# Patient Record
Sex: Female | Born: 1965 | Race: Black or African American | Hispanic: No | Marital: Married | Smoking: Never smoker
Health system: Southern US, Community
[De-identification: ages and names within clinical notes are randomized; demographics above are authoritative.]

## PROBLEM LIST (undated history)

## (undated) DIAGNOSIS — N951 Menopausal and female climacteric states: Secondary | ICD-10-CM

## (undated) DIAGNOSIS — Z8742 Personal history of other diseases of the female genital tract: Secondary | ICD-10-CM

## (undated) DIAGNOSIS — N898 Other specified noninflammatory disorders of vagina: Secondary | ICD-10-CM

## (undated) DIAGNOSIS — B1910 Unspecified viral hepatitis B without hepatic coma: Secondary | ICD-10-CM

## (undated) DIAGNOSIS — N840 Polyp of corpus uteri: Secondary | ICD-10-CM

## (undated) DIAGNOSIS — Z8679 Personal history of other diseases of the circulatory system: Secondary | ICD-10-CM

## (undated) DIAGNOSIS — D649 Anemia, unspecified: Secondary | ICD-10-CM

## (undated) DIAGNOSIS — N950 Postmenopausal bleeding: Secondary | ICD-10-CM

## (undated) DIAGNOSIS — D219 Benign neoplasm of connective and other soft tissue, unspecified: Secondary | ICD-10-CM

## (undated) DIAGNOSIS — Z87898 Personal history of other specified conditions: Secondary | ICD-10-CM

## (undated) DIAGNOSIS — E079 Disorder of thyroid, unspecified: Secondary | ICD-10-CM

## (undated) DIAGNOSIS — Z8619 Personal history of other infectious and parasitic diseases: Secondary | ICD-10-CM

## (undated) DIAGNOSIS — M62830 Muscle spasm of back: Secondary | ICD-10-CM

## (undated) DIAGNOSIS — Z87440 Personal history of urinary (tract) infections: Secondary | ICD-10-CM

## (undated) HISTORY — DX: Personal history of other infectious and parasitic diseases: Z86.19

## (undated) HISTORY — DX: Postmenopausal bleeding: N95.0

## (undated) HISTORY — DX: Benign neoplasm of connective and other soft tissue, unspecified: D21.9

## (undated) HISTORY — DX: Polyp of corpus uteri: N84.0

## (undated) HISTORY — DX: Unspecified viral hepatitis B without hepatic coma: B19.10

## (undated) HISTORY — PX: WISDOM TOOTH EXTRACTION: SHX21

## (undated) HISTORY — DX: Personal history of urinary (tract) infections: Z87.440

## (undated) HISTORY — DX: Personal history of other diseases of the female genital tract: Z87.42

## (undated) HISTORY — DX: Personal history of other diseases of the circulatory system: Z86.79

## (undated) HISTORY — DX: Personal history of other specified conditions: Z87.898

## (undated) HISTORY — DX: Other specified noninflammatory disorders of vagina: N89.8

## (undated) HISTORY — DX: Anemia, unspecified: D64.9

## (undated) HISTORY — DX: Menopausal and female climacteric states: N95.1

---

## 1998-10-24 ENCOUNTER — Inpatient Hospital Stay (HOSPITAL_COMMUNITY): Admission: AD | Admit: 1998-10-24 | Discharge: 1998-10-26 | Payer: Self-pay | Admitting: Obstetrics and Gynecology

## 1998-10-26 ENCOUNTER — Encounter (HOSPITAL_COMMUNITY): Admission: RE | Admit: 1998-10-26 | Discharge: 1999-01-24 | Payer: Self-pay | Admitting: Obstetrics and Gynecology

## 1999-06-19 ENCOUNTER — Emergency Department (HOSPITAL_COMMUNITY): Admission: EM | Admit: 1999-06-19 | Discharge: 1999-06-19 | Payer: Self-pay | Admitting: Emergency Medicine

## 2001-10-07 ENCOUNTER — Ambulatory Visit (HOSPITAL_COMMUNITY): Admission: RE | Admit: 2001-10-07 | Discharge: 2001-10-07 | Payer: Self-pay | Admitting: Internal Medicine

## 2001-10-07 ENCOUNTER — Encounter: Payer: Self-pay | Admitting: Internal Medicine

## 2001-12-27 ENCOUNTER — Encounter: Payer: Self-pay | Admitting: *Deleted

## 2001-12-27 ENCOUNTER — Ambulatory Visit (HOSPITAL_COMMUNITY): Admission: RE | Admit: 2001-12-27 | Discharge: 2001-12-27 | Payer: Self-pay | Admitting: *Deleted

## 2002-03-23 ENCOUNTER — Encounter: Payer: Self-pay | Admitting: Internal Medicine

## 2002-03-23 ENCOUNTER — Ambulatory Visit (HOSPITAL_COMMUNITY): Admission: RE | Admit: 2002-03-23 | Discharge: 2002-03-23 | Payer: Self-pay | Admitting: Internal Medicine

## 2003-07-11 ENCOUNTER — Emergency Department (HOSPITAL_COMMUNITY): Admission: EM | Admit: 2003-07-11 | Discharge: 2003-07-11 | Payer: Self-pay | Admitting: Emergency Medicine

## 2003-08-09 ENCOUNTER — Ambulatory Visit (HOSPITAL_COMMUNITY): Admission: RE | Admit: 2003-08-09 | Discharge: 2003-08-09 | Payer: Self-pay | Admitting: Internal Medicine

## 2004-03-29 ENCOUNTER — Ambulatory Visit (HOSPITAL_COMMUNITY): Admission: RE | Admit: 2004-03-29 | Discharge: 2004-03-29 | Payer: Self-pay | Admitting: Internal Medicine

## 2006-03-26 ENCOUNTER — Ambulatory Visit: Payer: Self-pay | Admitting: Internal Medicine

## 2006-03-26 LAB — CONVERTED CEMR LAB: WBC, blood: 5.2 10*3/uL

## 2006-03-27 ENCOUNTER — Encounter (INDEPENDENT_AMBULATORY_CARE_PROVIDER_SITE_OTHER): Payer: Self-pay | Admitting: Internal Medicine

## 2006-03-27 LAB — CONVERTED CEMR LAB: TSH: 0.6 u[IU]/mL

## 2006-03-30 ENCOUNTER — Encounter: Payer: Self-pay | Admitting: Internal Medicine

## 2006-03-30 DIAGNOSIS — J45909 Unspecified asthma, uncomplicated: Secondary | ICD-10-CM | POA: Insufficient documentation

## 2007-08-27 ENCOUNTER — Ambulatory Visit: Payer: Self-pay | Admitting: Internal Medicine

## 2007-08-27 ENCOUNTER — Other Ambulatory Visit: Admission: RE | Admit: 2007-08-27 | Discharge: 2007-08-27 | Payer: Self-pay | Admitting: Internal Medicine

## 2007-08-27 ENCOUNTER — Encounter (INDEPENDENT_AMBULATORY_CARE_PROVIDER_SITE_OTHER): Payer: Self-pay | Admitting: Internal Medicine

## 2007-08-27 ENCOUNTER — Telehealth (INDEPENDENT_AMBULATORY_CARE_PROVIDER_SITE_OTHER): Payer: Self-pay | Admitting: *Deleted

## 2007-08-27 DIAGNOSIS — J309 Allergic rhinitis, unspecified: Secondary | ICD-10-CM | POA: Insufficient documentation

## 2007-09-08 ENCOUNTER — Ambulatory Visit (HOSPITAL_COMMUNITY): Admission: RE | Admit: 2007-09-08 | Discharge: 2007-09-08 | Payer: Self-pay | Admitting: Internal Medicine

## 2007-09-10 ENCOUNTER — Encounter (INDEPENDENT_AMBULATORY_CARE_PROVIDER_SITE_OTHER): Payer: Self-pay | Admitting: Internal Medicine

## 2007-09-14 ENCOUNTER — Encounter (INDEPENDENT_AMBULATORY_CARE_PROVIDER_SITE_OTHER): Payer: Self-pay | Admitting: Internal Medicine

## 2007-09-14 LAB — CONVERTED CEMR LAB
BUN: 14 mg/dL (ref 6–23)
CO2: 27 meq/L (ref 19–32)
Glucose, Bld: 96 mg/dL (ref 70–99)
Potassium: 4.4 meq/L (ref 3.5–5.3)
Sodium: 141 meq/L (ref 135–145)
Total CHOL/HDL Ratio: 3.6
VLDL: 12 mg/dL (ref 0–40)

## 2007-09-22 ENCOUNTER — Encounter (INDEPENDENT_AMBULATORY_CARE_PROVIDER_SITE_OTHER): Payer: Self-pay | Admitting: Internal Medicine

## 2008-01-26 ENCOUNTER — Ambulatory Visit: Payer: Self-pay | Admitting: Internal Medicine

## 2008-01-26 DIAGNOSIS — N760 Acute vaginitis: Secondary | ICD-10-CM | POA: Insufficient documentation

## 2008-01-26 LAB — CONVERTED CEMR LAB
Chlamydia, DNA Probe: NEGATIVE
GC Probe Amp, Genital: NEGATIVE

## 2008-02-03 ENCOUNTER — Encounter (INDEPENDENT_AMBULATORY_CARE_PROVIDER_SITE_OTHER): Payer: Self-pay | Admitting: Internal Medicine

## 2008-02-03 LAB — CONVERTED CEMR LAB: Candida species: NEGATIVE

## 2008-02-07 ENCOUNTER — Telehealth (INDEPENDENT_AMBULATORY_CARE_PROVIDER_SITE_OTHER): Payer: Self-pay | Admitting: Internal Medicine

## 2008-05-26 ENCOUNTER — Emergency Department (HOSPITAL_COMMUNITY): Admission: EM | Admit: 2008-05-26 | Discharge: 2008-05-27 | Payer: Self-pay | Admitting: Emergency Medicine

## 2008-07-19 ENCOUNTER — Ambulatory Visit: Payer: Self-pay | Admitting: Internal Medicine

## 2008-07-19 DIAGNOSIS — M545 Low back pain, unspecified: Secondary | ICD-10-CM | POA: Insufficient documentation

## 2008-07-19 DIAGNOSIS — N951 Menopausal and female climacteric states: Secondary | ICD-10-CM | POA: Insufficient documentation

## 2008-07-20 ENCOUNTER — Telehealth (INDEPENDENT_AMBULATORY_CARE_PROVIDER_SITE_OTHER): Payer: Self-pay | Admitting: Internal Medicine

## 2008-11-28 ENCOUNTER — Ambulatory Visit: Payer: Self-pay | Admitting: Internal Medicine

## 2008-11-29 LAB — CONVERTED CEMR LAB
BUN: 11 mg/dL (ref 6–23)
CO2: 27 meq/L (ref 19–32)
Calcium: 9.5 mg/dL (ref 8.4–10.5)
Cholesterol: 247 mg/dL — ABNORMAL HIGH (ref 0–200)
Creatinine, Ser: 0.86 mg/dL (ref 0.40–1.20)
Glucose, Bld: 79 mg/dL (ref 70–99)

## 2008-12-13 ENCOUNTER — Ambulatory Visit (HOSPITAL_COMMUNITY): Admission: RE | Admit: 2008-12-13 | Discharge: 2008-12-13 | Payer: Self-pay | Admitting: Internal Medicine

## 2009-01-09 ENCOUNTER — Encounter (INDEPENDENT_AMBULATORY_CARE_PROVIDER_SITE_OTHER): Payer: Self-pay | Admitting: Internal Medicine

## 2009-04-14 DIAGNOSIS — N840 Polyp of corpus uteri: Secondary | ICD-10-CM

## 2009-04-14 HISTORY — DX: Polyp of corpus uteri: N84.0

## 2009-06-11 ENCOUNTER — Ambulatory Visit: Payer: Self-pay | Admitting: Family Medicine

## 2009-06-11 DIAGNOSIS — E785 Hyperlipidemia, unspecified: Secondary | ICD-10-CM | POA: Insufficient documentation

## 2009-06-11 DIAGNOSIS — N950 Postmenopausal bleeding: Secondary | ICD-10-CM | POA: Insufficient documentation

## 2009-06-19 ENCOUNTER — Encounter: Payer: Self-pay | Admitting: Physician Assistant

## 2009-06-20 LAB — CONVERTED CEMR LAB
Cholesterol: 209 mg/dL — ABNORMAL HIGH (ref 0–200)
HDL: 52 mg/dL (ref >39)
Total CHOL/HDL Ratio: 4
Triglycerides: 57 mg/dL (ref <150)
VLDL: 11 mg/dL (ref 0–40)

## 2009-11-08 ENCOUNTER — Emergency Department (HOSPITAL_COMMUNITY)
Admission: EM | Admit: 2009-11-08 | Discharge: 2009-11-08 | Payer: Self-pay | Source: Home / Self Care | Admitting: Emergency Medicine

## 2010-01-31 ENCOUNTER — Ambulatory Visit: Payer: Self-pay | Admitting: Physician Assistant

## 2010-01-31 DIAGNOSIS — J019 Acute sinusitis, unspecified: Secondary | ICD-10-CM | POA: Insufficient documentation

## 2010-01-31 DIAGNOSIS — H6120 Impacted cerumen, unspecified ear: Secondary | ICD-10-CM | POA: Insufficient documentation

## 2010-04-14 DIAGNOSIS — Z8742 Personal history of other diseases of the female genital tract: Secondary | ICD-10-CM

## 2010-04-14 DIAGNOSIS — Z87898 Personal history of other specified conditions: Secondary | ICD-10-CM

## 2010-04-14 HISTORY — DX: Personal history of other diseases of the female genital tract: Z87.42

## 2010-04-14 HISTORY — DX: Personal history of other specified conditions: Z87.898

## 2010-04-30 ENCOUNTER — Ambulatory Visit (HOSPITAL_COMMUNITY)
Admission: RE | Admit: 2010-04-30 | Discharge: 2010-04-30 | Payer: Self-pay | Source: Home / Self Care | Attending: Obstetrics and Gynecology | Admitting: Obstetrics and Gynecology

## 2010-05-05 ENCOUNTER — Encounter: Payer: Self-pay | Admitting: Internal Medicine

## 2010-05-16 NOTE — Assessment & Plan Note (Signed)
Summary: New Patient/rrs   Vital Signs:  Patient profile:   45 year old female Menstrual status:  postmenopausal Height:      68 inches Weight:      215 pounds BMI:     32.81 O2 Sat:      97 % on Room air Pulse rate:   81 / minute Resp:     16 per minute BP sitting:   110 / 80  (left arm)  Vitals Entered By: Adella Hare LPN (June 11, 2009 3:29 PM) CC: new patient Is Patient Diabetic? No Pain Assessment Patient in pain? no        CC:  new patient.  History of Present Illness: Pt here to establish care with aPCP.  c/o hot flashes daily, disruptive to sleep, wakes her up at night.  Pt states she had menstrual bleeding about 2 wks ago x 3-4 days.  Had been a couple of yrs since her LMP.  No HRT hx.  Did have Premarin prescribed by Dr Jen Mow, but never took them.  Hx of seasonal allergies & asthma.  In the past had prescription for zyrtec.  Uses albuterol occasionally. Usually only needs it during an allergy flare up.   Asthma History    Asthma Control Assessment:    Age range: 12+ years    Symptoms: 0-2 days/week    Nighttime Awakenings: 0-2/month    Interferes w/ normal activity: no limitations    SABA use (not for EIB): 0-2 days/week    Exacerbations requiring oral systemic steroids: 0-1/year    Asthma Control Assessment: Well Controlled   Current Medications (verified): 1)  None  Allergies (verified): 1)  Codeine  Past History:  Past medical, surgical, family and social histories (including risk factors) reviewed for relevance to current acute and chronic problems.  Past Medical History: Reviewed history from 08/27/2007 and no changes required. Asthma Allergic rhinitis  Past Surgical History: Reviewed history from 08/27/2007 and no changes required. Denies surgical history  Family History: Reviewed history from 07/19/2008 and no changes required. father-63HTN, hyperlipidemia mother-60-hyperlipidemia sister-39 brother-38 daughter-9  Social  History: Reviewed history from 08/27/2007 and no changes required. Married lives with spouse and child Never Smoked Alcohol use-no Drug use-no  Review of Systems General:  Denies chills and fever. ENT:  Denies earache, nasal congestion, sinus pressure, and sore throat. CV:  Denies chest pain or discomfort. Resp:  Denies cough and shortness of breath. GU:  Complains of abnormal vaginal bleeding; denies discharge and dysuria. Heme:  Denies enlarge lymph nodes and fevers. Allergy:  Complains of seasonal allergies.  Physical Exam  General:  Well-developed,well-nourished,in no acute distress; alert,appropriate and cooperative throughout examination Head:  Normocephalic and atraumatic without obvious abnormalities. No apparent alopecia or balding. Ears:  External ear exam shows no significant lesions or deformities.  Otoscopic examination reveals clear canals, tympanic membranes are intact bilaterally without bulging, retraction, inflammation or discharge. Hearing is grossly normal bilaterally. Nose:  External nasal examination shows no deformity or inflammation. Nasal mucosa are pink and moist without lesions or exudates. Mouth:  Oral mucosa and oropharynx without lesions or exudates.  Teeth in good repair. Neck:  No deformities, masses, or tenderness noted. Lungs:  Normal respiratory effort, chest expands symmetrically. Lungs are clear to auscultation, no crackles or wheezes. Heart:  Normal rate and regular rhythm. S1 and S2 normal without gallop, murmur, click, rub or other extra sounds. Cervical Nodes:  No lymphadenopathy noted Psych:  Cognition and judgment appear intact. Alert and cooperative with  normal attention span and concentration. No apparent delusions, illusions, hallucinations   Impression & Recommendations:  Problem # 1:  POSTMENOPAUSAL BLEEDING (ICD-627.1) Assessment New  The following medications were removed from the medication list:    Prempro 0.3-1.5 Mg Tabs (Conj  estrog-medroxyprogest ace) .Marland Kitchen... 1 by mouth once daily  Orders: T-TSH ([address]109-60454) Gynecologic Referral (Gyn)  Problem # 2:  MENOPAUSE-RELATED VASOMOTOR SYMPTOMS, HOT FLASHES (ICD-627.2) Assessment: Unchanged Discussed with pt that she would need evaluation of post menopausal bleeeding before consideration or discussion of HRT.  She can discuss this further with the gyn.  This has been an ongoing problem.   The following medications were removed from the medication list:    Prempro 0.3-1.5 Mg Tabs (Conj estrog-medroxyprogest ace) .Marland Kitchen... 1 by mouth once daily  Orders: Gynecologic Referral (Gyn)  Problem # 3:  ALLERGIC RHINITIS (ICD-477.9) Assessment: Unchanged  Her updated medication list for this problem includes:    Fexofenadine Hcl 180 Mg Tabs (Fexofenadine hcl) .Marland Kitchen... 1 daily for allergies  Problem # 4:  ASTHMA (ICD-493.90) Assessment: Improved  The following medications were removed from the medication list:    Proventil Hfa 108 (90 Base) Mcg/act Aers (Albuterol sulfate)    Symbicort 80-4.5 Mcg/act Aero (Budesonide-formoterol fumarate) .Marland Kitchen... 2 puffs two times a day  Problem # 5:  HYPERLIPIDEMIA (ICD-272.4) Pt recently started exercising with a trainer.  Encouraged her to continue.  Also to eat a healthier diet.   Orders: T-Lipid Profile (09811-91478)    HDL:55 (11/28/2008), 65 (09/10/2007)  LDL:170 (11/28/2008), 157 (09/10/2007)  Chol:247 (11/28/2008), 234 (09/10/2007)  Trig:108 (11/28/2008), 62 (09/10/2007)  Complete Medication List: 1)  Fexofenadine Hcl 180 Mg Tabs (Fexofenadine hcl) .Marland Kitchen.. 1 daily for allergies  Other Orders: T-Vitamin D (25-Hydroxy) ([id number])  Patient Instructions: 1)  Please schedule a follow-up appointment in 3 months. 2)  We will refer you to a gynecologist for evaluation of your vaginal bleeding. 3)  Have blood work drawn fasting to check your cholesterol levels. 4)  It is important that you exercise regularly at least 20 minutes 5 times a  week. If you develop chest pain, have severe difficulty breathing, or feel very tired , stop exercising immediately and seek medical attention. 5)  You need to lose weight. Consider a lower calorie diet and regular exercise.  Prescriptions: FEXOFENADINE HCL 180 MG TABS (FEXOFENADINE HCL) 1 daily for allergies  #30 x 5   Entered and Authorized by:   Esperanza Sheets PA   Signed by:   Adella Hare LPN on 57/84/6962   Method used:   Electronically to        [id number]       [address]       [place], [place]  [address]       Ph: [phone]       Fax: [phone]   RxID:   [id number]

## 2010-05-16 NOTE — Assessment & Plan Note (Signed)
Summary: earache/slj   Vital Signs:  Patient profile:   45 year old female Menstrual status:  postmenopausal Height:      68 inches Weight:      215.25 pounds BMI:     32.85 O2 Sat:      99 % on Room air Pulse rate:   81 / minute Resp:     16 per minute BP sitting:   118 / 80  (left arm)  Vitals Entered By: Mauricia Area CMA (January 31, 2010 11:08 AM) CC: Left earache for approximately 1 week   CC:  Left earache for approximately 1 week.  History of Present Illness: Pt presents today with c/o Lt ear pain x couple days.  Worse at night when lies down.  Using Motrin, and does help.  Also is having nasal congestion, post nasal drainage, and nonprod cough. Hx of asthma and has been using inhaler more frequently due to cough and chest congestion. Pt shows me a Symbicort inhaler and admits using this prn, 3-4 times a day. No albuterol inhaler at home.  No fever or chills.   Current Medications (verified): 1)  Motrin 200 Mg .Marland Kitchen.. 1 Tab By Mouth 4 Times A Day 2)  Symbicort 80-4.5 Mcg/act Aero (Budesonide-Formoterol Fumarate) .... 2 Puffs 2-3 Times Day  Allergies (verified): 1)  Codeine  Past History:  Past medical history reviewed for relevance to current acute and chronic problems.  Past Medical History: Reviewed history from 08/27/2007 and no changes required. Asthma Allergic rhinitis  Review of Systems General:  Denies chills and fever. ENT:  Complains of earache and postnasal drainage; denies sinus pressure and sore throat. CV:  Denies chest pain or discomfort and palpitations. Resp:  Complains of cough, shortness of breath, and wheezing; denies sputum productive.  Physical Exam  General:  Well-developed,well-nourished,in no acute distress; alert,appropriate and cooperative throughout examination Head:  Normocephalic and atraumatic without obvious abnormalities. No apparent alopecia or balding. Ears:  R ear normal.  Lt EAC lg amt of cerumen present, after irrg Lt TM  slightly bulging w/o erythema Nose:  External nasal examination shows no deformity or inflammation. Nasal mucosa are pink and moist without lesions or exudates. Mouth:  Oral mucosa and oropharynx without lesions or exudates.  Teeth in good repair. Neck:  No deformities, masses, or tenderness noted. Lungs:  Normal respiratory effort, chest expands symmetrically. Lungs are clear to auscultation, no crackles or wheezes. Heart:  Normal rate and regular rhythm. S1 and S2 normal without gallop, murmur, click, rub or other extra sounds. Cervical Nodes:  No lymphadenopathy noted Psych:  Cognition and judgment appear intact. Alert and cooperative with normal attention span and concentration. No apparent delusions, illusions, hallucinations   Impression & Recommendations:  Problem # 1:  SINUSITIS, ACUTE (ICD-461.9) Assessment New  Her updated medication list for this problem includes:    Amoxicillin 875 Mg Tabs (Amoxicillin) .Marland Kitchen... Take 1 two times a day x 10 days  Problem # 2:  CERUMEN IMPACTION, LEFT (ICD-380.4)  Orders: Cerumen Impaction Removal (04540)  Complete Medication List: 1)  Ibuprofen 800 Mg Tabs (Ibuprofen) .... Take one tab three times a day as needed for pain 2)  Symbicort 80-4.5 Mcg/act Aero (Budesonide-formoterol fumarate) .... 2 puffs two times a day for asthma 3)  Ventolin Hfa 108 (90 Base) Mcg/act Aers (Albuterol sulfate) .... 2 puffs every 4 hrs as needed 4)  Amoxicillin 875 Mg Tabs (Amoxicillin) .... Take 1 two times a day x 10 days  Patient Instructions: 1)  Please schedule a follow-up appointment in 2 months. 2)  I have refilled your inhalers.  Symbicort is a preventive inhaler only, and is not for 'rescue" use.  Use albuterol inhaler for "rescue", this is the Ventolin inhaler. 3)  I have prescribed  an antibiotic for you. Prescriptions: SYMBICORT 80-4.5 MCG/ACT AERO (BUDESONIDE-FORMOTEROL FUMARATE) 2 puffs two times a day for asthma  #1 x 2   Entered and Authorized  by:   Esperanza Sheets PA   Signed by:   Esperanza Sheets PA on 01/31/2010   Method used:   [facility]ron[facility] to        [phone][facility] (retail)       [address]       [place], [place]  [address]       Ph: [phone]       Fax: [phone]   RxID:   [id number] IBUPROFEN 800 MG TABS (IBUPROFEN) take one tab three times a day as needed for pain  #30 x 0   Entered and Authorized by:   Esperanza Sheets PA   Signed by:   Esperanza Sheets PA on 01/31/2010   Method used:   Electronically to        [id number]       [address]       [place], [place]  [address]       Ph: [phone]       Fax: [phone]   RxID:   [id number] AMOXICILLIN 875 MG TABS (AMOXICILLIN) take 1 two times a day x 10 days  #20 x 0   Entered and Authorized by:   Esperanza Sheets PA   Signed by:   Esperanza Sheets PA on 01/31/2010   Method used:   Electronically to        [id number]       [address]       [place], [place]  [address]       Ph: [phone]       Fax: [phone]   RxID:   [id number] VENTOLIN HFA 108 (90 BASE) MCG/ACT AERS (ALBUTEROL SULFATE) 2 puffs every 4 hrs as needed  #1 x 1   Entered and Authorized by:   Esperanza Sheets PA   Signed by:   Esperanza Sheets PA on 01/31/2010   Method used:   Electronically to        [facility] [phone]* (retail)       [address]       [place], [place]  [address]       Ph: [phone]       Fax: [phone]   RxID:   [id number]    Orders Added: 1)  Est. Patient Level III [27782] 2)  Cerumen Impaction Removal [42353]

## 2010-06-29 LAB — RAPID STREP SCREEN (MED CTR MEBANE ONLY): Streptococcus, Group A Screen (Direct): NEGATIVE

## 2010-12-25 DIAGNOSIS — N898 Other specified noninflammatory disorders of vagina: Secondary | ICD-10-CM

## 2010-12-25 HISTORY — DX: Other specified noninflammatory disorders of vagina: N89.8

## 2010-12-26 ENCOUNTER — Ambulatory Visit: Payer: Self-pay | Admitting: Family Medicine

## 2011-03-08 ENCOUNTER — Emergency Department (HOSPITAL_COMMUNITY)
Admission: EM | Admit: 2011-03-08 | Discharge: 2011-03-08 | Disposition: A | Discharge status: Home / Self Care | Payer: BC Managed Care – PPO | Attending: Emergency Medicine | Admitting: Emergency Medicine

## 2011-03-08 ENCOUNTER — Encounter: Payer: Self-pay | Admitting: *Deleted

## 2011-03-08 DIAGNOSIS — M549 Dorsalgia, unspecified: Secondary | ICD-10-CM

## 2011-03-08 DIAGNOSIS — M546 Pain in thoracic spine: Secondary | ICD-10-CM | POA: Insufficient documentation

## 2011-03-08 HISTORY — DX: Muscle spasm of back: M62.830

## 2011-03-08 MED ORDER — IBUPROFEN 800 MG PO TABS: 800.0000 mg | ORAL_TABLET | Freq: Three times a day (TID) | ORAL | Status: AC

## 2011-03-08 MED ORDER — METHOCARBAMOL 500 MG PO TABS: 500.0000 mg | ORAL_TABLET | Freq: Two times a day (BID) | ORAL | Status: AC

## 2011-03-08 MED ORDER — TRAMADOL HCL 50 MG PO TABS: 50.0000 mg | ORAL_TABLET | Freq: Four times a day (QID) | ORAL | Status: AC | PRN

## 2011-03-08 NOTE — ED Notes (Signed)
Pt. Has a hx. Of "back spasms."  Pt. Reports that she awoke last night with "back spams" that are causing her to have shortness of breath.  Pt. Denies n/v/d, fever, and denies pain when she voids.  Pt. Denies any recent injuries.

## 2011-03-08 NOTE — ED Provider Notes (Signed)
History     CSN: [id number] Arrival date & time: 03/08/2011  7:46 AM   First MD Initiated Contact with Patient 03/08/11 0752     8:44 AM HPI Patient is a 45 y.o. female presenting with back pain. The history is provided by the patient.  Back Pain  This is a new problem. The current episode started more than 1 week ago. The problem has been gradually worsening. The pain is associated with no known injury. Pain location: Bilateral upper back. Quality: Spasms. The pain is severe. The symptoms are aggravated by bending, twisting and certain positions (Palpation). Pertinent negatives include no chest pain, no fever, no numbness, no headaches, no abdominal pain, no bowel incontinence, no perianal numbness, no bladder incontinence, no dysuria, no pelvic pain, no leg pain, no paresthesias, no paresis, no tingling and no weakness. She has tried heat, ice and NSAIDs for the symptoms. The treatment provided mild relief.    Past Medical History  Diagnosis Date  . Back spasm     History reviewed. No pertinent past surgical history.  History reviewed. No pertinent family history.  History  Substance Use Topics  . Smoking status: Not on file  . Smokeless tobacco: Not on file  . Alcohol Use: No    OB History    Grav Para Term Preterm Abortions TAB SAB Ect Mult Living                  Review of Systems  Constitutional: Negative for fever and chills.  HENT: Negative for neck pain and neck stiffness.   Cardiovascular: Negative for chest pain.  Gastrointestinal: Negative for abdominal pain and bowel incontinence.  Genitourinary: Negative for bladder incontinence, dysuria, urgency, frequency, hematuria, flank pain and pelvic pain.  Musculoskeletal: Positive for back pain.       Denies saddle anesthesias, or perineal numbness, urinary or bowel incontinence  Neurological: Negative for tingling, weakness, numbness, headaches and paresthesias.    Allergies  Codeine  Home Medications    Current Outpatient Rx  Name Route Sig Dispense Refill  . IBUPROFEN 800 MG PO TABS Oral Take 800 mg by mouth daily.        BP 122/82  Pulse 70  Temp(Src) 98.1 F (36.7 C) (Oral)  Resp 16  SpO2 96%  LMP 02/17/2011  Physical Exam  Vitals reviewed. Constitutional: She is oriented to person, place, and time. She appears well-developed and well-nourished.  HENT:  Head: Normocephalic and atraumatic.  Eyes: Conjunctivae and EOM are normal. Pupils are equal, round, and reactive to light.  Neck: Normal range of motion. Neck supple. No spinous process tenderness and no muscular tenderness present. No edema, no erythema and normal range of motion present.  Cardiovascular: Normal rate, regular rhythm and normal heart sounds.  Exam reveals no friction rub.   No murmur heard. Pulmonary/Chest: Effort normal and breath sounds normal. She has no wheezes. She has no rales. She exhibits no tenderness.  Abdominal: Soft. Bowel sounds are normal. She exhibits no distension and no mass. There is no tenderness. There is no rebound and no guarding.          Musculoskeletal: Normal range of motion.       Cervical back: Normal. She exhibits normal range of motion, no tenderness, no bony tenderness, no swelling and no pain.       Thoracic back: Normal. She exhibits no tenderness, no bony tenderness, no swelling, no deformity and no pain.       Lumbar back: Normal.  She exhibits normal range of motion, no tenderness, no bony tenderness, no swelling, no deformity and no pain.       Arms:      No midline spinal tenderness. Has full range of motion. Pain with palpation over bilateral musculature of back. No erythema, deformities, masses. No cervical spine or neck tenderness. Full range of motion of arms and legs. Normal strength and sensation distally. Normal pulses distally.  Neurological: She is alert and oriented to person, place, and time. She has normal strength. No sensory deficit. Coordination and gait  normal.  Skin: Skin is warm and dry. No rash noted. No erythema. No pallor.    ED Course  Procedures  MDM           Thomasene Lot, [place] 03/08/11 [phone]

## 2011-03-08 NOTE — ED Provider Notes (Signed)
Evaluation and management procedures were performed by the PA/NP under my supervision/collaboration.   Dione Booze, MD 03/08/11 [phone]

## 2011-03-23 ENCOUNTER — Encounter (HOSPITAL_COMMUNITY): Payer: Self-pay | Admitting: *Deleted

## 2011-03-23 ENCOUNTER — Emergency Department (HOSPITAL_COMMUNITY)
Admission: EM | Admit: 2011-03-23 | Discharge: 2011-03-23 | Disposition: A | Discharge status: Home / Self Care | Payer: BC Managed Care – PPO | Attending: Emergency Medicine | Admitting: Emergency Medicine

## 2011-03-23 DIAGNOSIS — J111 Influenza due to unidentified influenza virus with other respiratory manifestations: Secondary | ICD-10-CM | POA: Insufficient documentation

## 2011-03-23 MED ORDER — OSELTAMIVIR PHOSPHATE 75 MG PO CAPS: 75.0000 mg | ORAL_CAPSULE | Freq: Two times a day (BID) | ORAL | Status: AC

## 2011-03-23 NOTE — ED Provider Notes (Signed)
History     CSN: [id number] Arrival date & time: 03/23/2011  1:06 AM   First MD Initiated Contact with Patient 03/23/11 0138      Chief Complaint  Patient presents with  . Cough  . Nasal Congestion    (Consider location/radiation/quality/duration/timing/severity/associated sxs/prior treatment) Patient is a 45 y.o. female presenting with cough. The history is provided by the patient. No language interpreter was used.  Cough This is a new problem. The current episode started 2 days ago. The problem occurs constantly. The problem has not changed since onset.The cough is non-productive. The maximum temperature recorded prior to her arrival was 100 to 100.9 F. The fever has been present for 1 to 2 days. Associated symptoms include ear pain, rhinorrhea, sore throat and myalgias. Pertinent negatives include no chest pain, no chills, no sweats, no weight loss, no ear congestion, no headaches, no shortness of breath, no wheezing and no eye redness. She has tried nothing for the symptoms. The treatment provided no relief. Risk factors: patient exposure. She is not a smoker. Her past medical history does not include pneumonia.    Past Medical History  Diagnosis Date  . Back spasm     History reviewed. No pertinent past surgical history.  History reviewed. No pertinent family history.  History  Substance Use Topics  . Smoking status: Not on file  . Smokeless tobacco: Not on file  . Alcohol Use: No    OB History    Grav Para Term Preterm Abortions TAB SAB Ect Mult Living                  Review of Systems  Constitutional: Negative for chills and weight loss.  HENT: Positive for ear pain, sore throat and rhinorrhea.   Eyes: Negative for redness.  Respiratory: Positive for cough. Negative for shortness of breath and wheezing.   Cardiovascular: Negative for chest pain.  Gastrointestinal: Negative for abdominal pain.  Genitourinary: Negative for difficulty urinating.    Musculoskeletal: Positive for myalgias.  Skin: Negative.   Neurological: Negative for headaches.  Hematological: Negative.   Psychiatric/Behavioral: Negative.     Allergies  Codeine  Home Medications   Current Outpatient Rx  Name Route Sig Dispense Refill  . IBUPROFEN 800 MG PO TABS Oral Take 800 mg by mouth daily.      . OSELTAMIVIR PHOSPHATE 75 MG PO CAPS Oral Take 1 capsule (75 mg total) by mouth 2 (two) times daily. 10 capsule 0    BP 146/58  Pulse 64  Temp 98.4 F (36.9 C)  Resp 20  Ht 5\' 9"  (1.753 m)  Wt 205 lb (92.987 kg)  BMI 30.27 kg/m2  SpO2 96%  LMP 02/17/2011  Physical Exam  Constitutional: She is oriented to person, place, and time. She appears well-developed and well-nourished. No distress.  HENT:  Head: Normocephalic and atraumatic.  Right Ear: No mastoid tenderness. Tympanic membrane is not injected. No hemotympanum.  Left Ear: No mastoid tenderness. Tympanic membrane is not injected. No hemotympanum.  Mouth/Throat: Oropharynx is clear and moist. No oropharyngeal exudate.  Eyes: Conjunctivae and EOM are normal. Pupils are equal, round, and reactive to light.  Neck: Normal range of motion. Neck supple. No tracheal deviation present.  Cardiovascular: Normal rate and regular rhythm.  Exam reveals no friction rub.   Pulmonary/Chest: Effort normal and breath sounds normal. She has no wheezes. She has no rales.  Abdominal: Soft. Bowel sounds are normal.  Musculoskeletal: Normal range of motion. She exhibits no edema.  Neurological: She is alert and oriented to person, place, and time.  Skin: Skin is warm and dry.  Psychiatric: Thought content normal.    ED Course  Procedures (including critical care time)  Labs Reviewed - No data to display No results found.   1. Influenza       MDM  Symptoms suspicious for influenza.  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  Take tamiflu for symptoms and tylenol and motrin.  Follow up with  family doctor in 2 days for recheck return for worsening symptoms.  Patient verbalizes understanding and agrees to follow up       Maxime Beckner Smitty Cords, MD 03/23/11 0157

## 2011-03-23 NOTE — ED Notes (Signed)
Pt c/o cough, congestion, and sneezing since Thursday.

## 2011-03-23 NOTE — ED Notes (Signed)
Pt reports cough and congestion since Thursday.  Denies cough being productive, denies fever.

## 2011-05-15 ENCOUNTER — Encounter: Payer: Self-pay | Admitting: Family Medicine

## 2011-05-15 ENCOUNTER — Ambulatory Visit (INDEPENDENT_AMBULATORY_CARE_PROVIDER_SITE_OTHER): Payer: BC Managed Care – PPO | Admitting: Family Medicine

## 2011-05-15 DIAGNOSIS — J45909 Unspecified asthma, uncomplicated: Secondary | ICD-10-CM

## 2011-05-15 DIAGNOSIS — J069 Acute upper respiratory infection, unspecified: Secondary | ICD-10-CM

## 2011-05-15 MED ORDER — BENZONATATE 100 MG PO CAPS: 100.0000 mg | ORAL_CAPSULE | Freq: Three times a day (TID) | ORAL | Status: DC | PRN

## 2011-05-15 MED ORDER — AZITHROMYCIN 250 MG PO TABS: ORAL_TABLET | ORAL | Status: AC

## 2011-05-15 MED ORDER — BECLOMETHASONE DIPROPIONATE 40 MCG/ACT IN AERS: 2.0000 | INHALATION_SPRAY | Freq: Two times a day (BID) | RESPIRATORY_TRACT | Status: DC

## 2011-05-15 MED ORDER — ALBUTEROL SULFATE HFA 108 (90 BASE) MCG/ACT IN AERS: 2.0000 | INHALATION_SPRAY | RESPIRATORY_TRACT | Status: DC | PRN

## 2011-05-15 NOTE — Patient Instructions (Signed)
Take the antibiotics as prescribed Start the inhaler QVAR as prescribed - rinse your mouth after use Albuterol was refilled Tessalon Perrle for cough Return if you are not improved

## 2011-05-16 NOTE — Progress Notes (Signed)
  Subjective:    Patient ID: Brenda Montoya, female    DOB: Dec 10, 1965, 46 y.o.   MRN: [id number]  HPI   Cough for the past week with mild production. History of asthma. Has been using her albuterol more than usual. Occasionally has wheezing denies shortness of breath. She was treated for influenza approximately one month ago and completed a course of Tamiflu. She's also noticed she is very hoarse. Non smoker  Review of Systems  GEN- denies fatigue, fever, weight loss,weakness,+ recent illness HEENT- denies eye drainage, change in vision, nasal discharge, CVS- denies chest pain, palpitations RESP- denies SOB,+ cough,+ wheeze ABD- denies N/V, change in stools, abd pain MSK- denies joint pain, muscle aches, injury Neuro- denies headache, dizziness, syncope, seizure activity      Objective:   Physical Exam GEN- NAD, alert and oriented x3, hoarse HEENT- PERRL, EOMI, non injected sclera, pink conjunctiva, MMM, oropharynx clear, TM clear bilat Neck- Supple, no cervical nodes CVS- RRR, no murmur RESP-good air movement, harsh cough, normal WOB, few wheeze, no rhonchi, upper airway congestion EXT- No edema Pulses- Radial, DP- 2+        Assessment & Plan:    URI- will treat as second illness after influenza, does not appear to have pneumonia today however history of asthma. Treat for bronchitis- Z pak given  Asthma- pt to start QVAR for maintaince during winter months, albuterol refilled

## 2011-06-27 ENCOUNTER — Other Ambulatory Visit: Payer: Self-pay | Admitting: Obstetrics and Gynecology

## 2011-06-27 DIAGNOSIS — Z139 Encounter for screening, unspecified: Secondary | ICD-10-CM

## 2011-07-01 ENCOUNTER — Ambulatory Visit (HOSPITAL_COMMUNITY)
Admission: RE | Admit: 2011-07-01 | Discharge: 2011-07-01 | Disposition: A | Discharge status: Home / Self Care | Payer: BC Managed Care – PPO | Source: Ambulatory Visit | Attending: Obstetrics and Gynecology | Admitting: Obstetrics and Gynecology

## 2011-07-01 DIAGNOSIS — Z1231 Encounter for screening mammogram for malignant neoplasm of breast: Secondary | ICD-10-CM | POA: Insufficient documentation

## 2011-07-01 DIAGNOSIS — Z139 Encounter for screening, unspecified: Secondary | ICD-10-CM

## 2011-07-29 ENCOUNTER — Ambulatory Visit (INDEPENDENT_AMBULATORY_CARE_PROVIDER_SITE_OTHER): Payer: BC Managed Care – PPO | Admitting: Obstetrics and Gynecology

## 2011-07-29 VITALS — BP 116/70 | HR 78 | Temp 99.0°F | Resp 16 | Ht 69.0 in | Wt 211.0 lb

## 2011-07-29 DIAGNOSIS — N393 Stress incontinence (female) (male): Secondary | ICD-10-CM

## 2011-07-29 DIAGNOSIS — Z124 Encounter for screening for malignant neoplasm of cervix: Secondary | ICD-10-CM

## 2011-07-29 DIAGNOSIS — G4700 Insomnia, unspecified: Secondary | ICD-10-CM | POA: Insufficient documentation

## 2011-07-29 MED ORDER — ZOLPIDEM TARTRATE 10 MG PO TABS: 10.0000 mg | ORAL_TABLET | Freq: Every evening | ORAL | Status: DC | PRN

## 2011-07-29 NOTE — Progress Notes (Addendum)
Allergies: NKDA  LMP: Post menapausal  Last Pap: 06/2010- Normal    Contraception: No  Gravida: 1 Para: 1 Primary Doctor: Dr. Sharmaine Base EXAM  Regular Periods no   Monthly Breast Ex. yes Tetanus<10 years yes NI. Bladder Function yes Daily BM's yes Healthy Diet yes Calcium/Multi-vitamin no Mammogram yes Date: 06/2011 Smoker no How much? Alcohol Abuseno How much? Substance Abuseno How much? Exercise yes How much? 3 x's a week Seatbelts yes Abuse at Home no Stressful Work no Sigmoid/Colonoscopy in past 5 years no Medications:  Medical problems this year: None  PMH: No   FMH:  No  ABDOMINAL/PELVIC PAIN no  VAGINAL DISCHARGE no  IRREGULAR BLEEDING no  MENOPAUSAL SYMPTOMS yes  Irregular Periods no Mood Swings yes Hot Flashes yes Vaginal Dryness yes Poor Sleeping yes Urinary Urgency no UTI Symptoms no HRT no Fam. HX of Osteoporosis no Prior Bone Scan no Hx of Deep Vein Thrombosis no

## 2011-07-29 NOTE — Progress Notes (Signed)
Subjective:    Brenda Montoya is a 46 y.o. female who presents for annual exam. The patient has no complaints today. The patient is sexually active. GYN screening history: last pap: was normal and last mammogram: was normal. The patient is not taking hormone replacement therapy. Patient denies post-menopausal vaginal bleeding.. The patient wears seatbelts: yes. The patient participates in regular exercise: no. Has the patient ever been transfused or tattooed?: yes. The patient reports that there is not domestic violence in her life.Some SUI. Insomnia continues.   Menstrual History: OB History    Grav Para Term Preterm Abortions TAB SAB Ect Mult Living                  Menarche age: normal Patient's last menstrual period was 02/27/2011.    The following portions of the patient's history were reviewed and updated as appropriate: allergies, current medications, past family history, past medical history, past social history, past surgical history and problem list.  Review of Systems Pertinent items are noted in HPI.    Objective:    BP 116/70  Pulse 78  Temp 99 F (37.2 C)  Resp 16  Ht 5\' 9"  (1.753 m)  Wt 211 lb (95.709 kg)  BMI 31.16 kg/m2  LMP 02/27/2011 General appearance: alert, cooperative and appears stated age Neck: supple, symmetrical, trachea midline and thyroid not enlarged, symmetric, no tenderness/mass/nodules Lungs: clear to auscultation bilaterally Breasts: normal appearance, no masses or tenderness Heart: regular rate and rhythm, S1, S2 normal, no murmur, click, rub or gallop Abdomen: soft, non-tender; bowel sounds normal; no masses,  no organomegaly Pelvic: cervix normal in appearance, external genitalia normal, no adnexal masses or tenderness, no bladder tenderness, no cervical motion tenderness, rectovaginal septum normal, uterus normal size, shape, and consistency, vagina normal without discharge and small cystocele. Extremities: extremities normal, atraumatic,  no cyanosis or edema     Assessment:    Normal gyn exam Menopause SUI, Insomnia    Plan:    All questions answered. Follow up in 1 year. Mammogram. Pap smear. Kegels, Ambien 10 mg qhs prn insomnia.

## 2011-07-29 NOTE — Patient Instructions (Signed)
Urinary Incontinence Your doctor wants you to have this information about urinary incontinence. This is the inability to keep urine in your body until you decide to release it. CAUSES  Prostate gland enlargement is a common cause of urinary incontinence. But there are many different causes for losing urinary control. They include:  Medicines.   Infections.   Prostate problems.   Surgery.   Neurological diseases.   Emotional factors.  DIAGNOSIS  Evaluating the cause of incontinence is important in choosing the best treatment. This may require:  An ultrasound exam.   Kidney and bladder X-rays.   Cystoscopy. This is an exam of the bladder using a narrow scope.  TREATMENT  For incontinent patients, normal daily hygiene and using changing pads or adult diapers regularly will prevent offensive odors and skin damage from the moisture. Changing your medicines may help control incontinence. Your caregiver may prescribe some medicines to help you regain control. Avoid caffeine. It can over-stimulate the bladder. Use the bathroom regularly. Try about every 2 to 3 hours even if you do not feel the need. Take time to empty your bladder completely. After urinating, wait a minute. Then try to urinate again. External devices used to catch urine or an indwelling urine catheter (Foley catheter) may be needed as well. Some prostate gland problems require surgery to correct. Call your caregiver for more information. Document Released: 05/08/2004 Document Revised: 03/20/2011 Document Reviewed: 05/03/2008 [employer] Patient Information 2012 [employer]. 

## 2011-07-30 LAB — PAP IG W/ RFLX HPV ASCU

## 2011-07-31 ENCOUNTER — Ambulatory Visit: Payer: Self-pay | Admitting: Obstetrics and Gynecology

## 2011-12-30 ENCOUNTER — Encounter: Payer: Self-pay | Admitting: Family Medicine

## 2011-12-30 ENCOUNTER — Ambulatory Visit: Payer: BC Managed Care – PPO | Admitting: Family Medicine

## 2012-02-10 ENCOUNTER — Other Ambulatory Visit: Payer: Self-pay | Admitting: Internal Medicine

## 2012-02-10 DIAGNOSIS — E0590 Thyrotoxicosis, unspecified without thyrotoxic crisis or storm: Secondary | ICD-10-CM

## 2012-02-16 ENCOUNTER — Encounter (HOSPITAL_COMMUNITY): Payer: Self-pay | Admitting: Emergency Medicine

## 2012-02-16 DIAGNOSIS — J45909 Unspecified asthma, uncomplicated: Secondary | ICD-10-CM | POA: Insufficient documentation

## 2012-02-16 DIAGNOSIS — Z8619 Personal history of other infectious and parasitic diseases: Secondary | ICD-10-CM | POA: Insufficient documentation

## 2012-02-16 DIAGNOSIS — Z8679 Personal history of other diseases of the circulatory system: Secondary | ICD-10-CM | POA: Insufficient documentation

## 2012-02-16 DIAGNOSIS — E079 Disorder of thyroid, unspecified: Secondary | ICD-10-CM | POA: Insufficient documentation

## 2012-02-16 DIAGNOSIS — Z8669 Personal history of other diseases of the nervous system and sense organs: Secondary | ICD-10-CM | POA: Insufficient documentation

## 2012-02-16 DIAGNOSIS — Z8742 Personal history of other diseases of the female genital tract: Secondary | ICD-10-CM | POA: Insufficient documentation

## 2012-02-16 DIAGNOSIS — Z87448 Personal history of other diseases of urinary system: Secondary | ICD-10-CM | POA: Insufficient documentation

## 2012-02-16 DIAGNOSIS — Z862 Personal history of diseases of the blood and blood-forming organs and certain disorders involving the immune mechanism: Secondary | ICD-10-CM | POA: Insufficient documentation

## 2012-02-16 DIAGNOSIS — M7120 Synovial cyst of popliteal space [Baker], unspecified knee: Secondary | ICD-10-CM | POA: Insufficient documentation

## 2012-02-16 DIAGNOSIS — Z8739 Personal history of other diseases of the musculoskeletal system and connective tissue: Secondary | ICD-10-CM | POA: Insufficient documentation

## 2012-02-16 NOTE — ED Notes (Signed)
PT. REPORTS "KNOT" AT BACK OF RIGHT KNEE ONSET LAST THURS. DENIES INJURY , PAIN WITH PALPATION , NO DRAINAGE.

## 2012-02-17 ENCOUNTER — Emergency Department (HOSPITAL_COMMUNITY)
Admission: EM | Admit: 2012-02-17 | Discharge: 2012-02-17 | Disposition: A | Discharge status: Home / Self Care | Payer: BC Managed Care – PPO | Attending: Emergency Medicine | Admitting: Emergency Medicine

## 2012-02-17 DIAGNOSIS — M7120 Synovial cyst of popliteal space [Baker], unspecified knee: Secondary | ICD-10-CM

## 2012-02-17 HISTORY — DX: Disorder of thyroid, unspecified: E07.9

## 2012-02-17 MED ORDER — IBUPROFEN 600 MG PO TABS: 600.0000 mg | ORAL_TABLET | Freq: Once | ORAL | Status: DC

## 2012-02-17 MED ORDER — IBUPROFEN 400 MG PO TABS
600.0000 mg | ORAL_TABLET | Freq: Once | ORAL | Status: AC
  Administered 2012-02-17: 600 mg via ORAL
  Filled 2012-02-17: qty 1

## 2012-02-17 NOTE — ED Provider Notes (Signed)
Medical screening examination/treatment/procedure(s) were performed by non-physician practitioner and as supervising physician I was immediately available for consultation/collaboration.    Vida Roller, MD 02/17/12 [phone]

## 2012-02-17 NOTE — ED Provider Notes (Signed)
History     CSN: [id number]  Arrival date & time 02/16/12  2309   First MD Initiated Contact with Patient 02/17/12 0209      Chief Complaint  Patient presents with  . Abscess    (Consider location/radiation/quality/duration/timing/severity/associated sxs/prior treatment) HPI Comments: Patient has noticed a fullness in the back of her right knee.  He is an injury, swelling, or tenderness to her lower leg.  He has not had any recent travel.  She has no shortness of breath.  She does smoke.  She does not use any form of birth control  Patient is a 46 y.o. female presenting with abscess. The history is provided by the patient.  Abscess  This is a new problem. The current episode started less than one week ago. The problem occurs continuously. The problem has been unchanged. Pertinent negatives include no fever.    Past Medical History  Diagnosis Date  . Back spasm   . History of measles, mumps, or rubella   . History of chicken pox   . Asthma   . H/O varicose veins   . H/O bladder infections   . Anemia   . H/O insomnia 2012  . H/O vaginitis 2012  . PMB (postmenopausal bleeding)   . Endometrial polyp 2011  . Fibroid   . H/O amenorrhea   . Menopausal symptoms   . Vaginal irritation 12/25/2010    itching also  . Hepatitis B 1988 & 1989  . Thyroid disease     Past Surgical History  Procedure Date  . Wisdom tooth extraction     Family History  Problem Relation Age of Onset  . Hypertension Maternal Uncle     History  Substance Use Topics  . Smoking status: Never Smoker   . Smokeless tobacco: Not on file  . Alcohol Use: No    OB History    Grav Para Term Preterm Abortions TAB SAB Ect Mult Living                  Review of Systems  Constitutional: Negative for fever and chills.  Respiratory: Negative for shortness of breath.   Gastrointestinal: Negative for nausea.  Skin: Negative for wound.  Neurological: Negative for dizziness, weakness and numbness.     Allergies  Codeine  Home Medications   Current Outpatient Rx  Name  Route  Sig  Dispense  Refill  . ACETAMINOPHEN 500 MG PO TABS   Oral   Take 1,000 mg by mouth every 6 (six) hours as needed. For pain         . IBUPROFEN 600 MG PO TABS   Oral   Take 1 tablet (600 mg total) by mouth once.   30 tablet   9     BP 132/57  Pulse 89  Temp 98.6 F (37 C) (Oral)  Resp 14  SpO2 100%  LMP 02/27/2011  Physical Exam  Constitutional: She appears well-developed and well-nourished.  HENT:  Head: Normocephalic.  Eyes: Pupils are equal, round, and reactive to light.  Cardiovascular: Normal rate.   Pulmonary/Chest: Effort normal.  Musculoskeletal: Normal range of motion. She exhibits edema and tenderness.       Legs:   ED Course  Procedures (including critical care time)  Labs Reviewed - No data to display No results found.   1. Baker's cyst of knee       MDM  Bakers cyst will treat with compression, and anti-inflammatories if not getting, better.  Patient has been  instructed to follow up with her primary care physician        Arman Filter, NP 02/17/12 0236

## 2012-02-17 NOTE — Progress Notes (Signed)
Orthopedic Tech Progress Note Patient Details:  Brenda Montoya Feb 18, 1966 [id number]  Ortho Devices Type of Ortho Device: Knee Sleeve   Haskell Flirt 02/17/2012, 2:51 AM

## 2012-02-25 ENCOUNTER — Encounter (HOSPITAL_COMMUNITY): Admission: RE | Admit: 2012-02-25 | Payer: BC Managed Care – PPO | Source: Ambulatory Visit

## 2012-02-26 ENCOUNTER — Encounter (HOSPITAL_COMMUNITY): Payer: BC Managed Care – PPO

## 2012-03-15 ENCOUNTER — Encounter (HOSPITAL_COMMUNITY)
Admission: RE | Admit: 2012-03-15 | Discharge: 2012-03-15 | Disposition: A | Discharge status: Home / Self Care | Payer: BC Managed Care – PPO | Source: Ambulatory Visit | Attending: Internal Medicine | Admitting: Internal Medicine

## 2012-03-15 DIAGNOSIS — E0590 Thyrotoxicosis, unspecified without thyrotoxic crisis or storm: Secondary | ICD-10-CM | POA: Insufficient documentation

## 2012-03-16 ENCOUNTER — Encounter (HOSPITAL_COMMUNITY)
Admission: RE | Admit: 2012-03-16 | Discharge: 2012-03-16 | Disposition: A | Discharge status: Home / Self Care | Payer: BC Managed Care – PPO | Source: Ambulatory Visit | Attending: Internal Medicine | Admitting: Internal Medicine

## 2012-03-16 DIAGNOSIS — E0590 Thyrotoxicosis, unspecified without thyrotoxic crisis or storm: Secondary | ICD-10-CM | POA: Insufficient documentation

## 2012-03-16 MED ORDER — SODIUM PERTECHNETATE TC 99M INJECTION
9.5000 | Freq: Once | INTRAVENOUS | Status: AC | PRN
  Administered 2012-03-16: 10 via INTRAVENOUS

## 2012-03-16 MED ORDER — SODIUM IODIDE I 131 CAPSULE: 10.2000 | Freq: Once | INTRAVENOUS | Status: AC | PRN

## 2012-05-20 ENCOUNTER — Other Ambulatory Visit: Payer: Self-pay | Admitting: Internal Medicine

## 2012-05-20 DIAGNOSIS — E0500 Thyrotoxicosis with diffuse goiter without thyrotoxic crisis or storm: Secondary | ICD-10-CM

## 2012-05-20 DIAGNOSIS — E0590 Thyrotoxicosis, unspecified without thyrotoxic crisis or storm: Secondary | ICD-10-CM

## 2012-05-27 ENCOUNTER — Encounter (HOSPITAL_COMMUNITY)
Admission: RE | Admit: 2012-05-27 | Discharge: 2012-05-27 | Disposition: A | Discharge status: Home / Self Care | Payer: BC Managed Care – PPO | Source: Ambulatory Visit | Attending: Internal Medicine | Admitting: Internal Medicine

## 2012-05-27 DIAGNOSIS — E0500 Thyrotoxicosis with diffuse goiter without thyrotoxic crisis or storm: Secondary | ICD-10-CM

## 2012-05-27 DIAGNOSIS — E0590 Thyrotoxicosis, unspecified without thyrotoxic crisis or storm: Secondary | ICD-10-CM | POA: Insufficient documentation

## 2012-05-27 MED ORDER — SODIUM IODIDE I 131 CAPSULE: 1408.0000 | Freq: Once | INTRAVENOUS | Status: AC | PRN

## 2012-05-27 MED ORDER — SODIUM IODIDE I 131 CAPSULE
14.8000 | Freq: Once | INTRAVENOUS | Status: AC | PRN
  Administered 2012-05-27: 14.8 via ORAL

## 2012-08-03 ENCOUNTER — Other Ambulatory Visit: Payer: Self-pay

## 2012-08-03 DIAGNOSIS — Z1231 Encounter for screening mammogram for malignant neoplasm of breast: Secondary | ICD-10-CM

## 2012-08-31 ENCOUNTER — Ambulatory Visit
Admission: RE | Admit: 2012-08-31 | Discharge: 2012-08-31 | Disposition: A | Discharge status: Home / Self Care | Payer: BC Managed Care – PPO | Source: Ambulatory Visit

## 2012-08-31 DIAGNOSIS — Z1231 Encounter for screening mammogram for malignant neoplasm of breast: Secondary | ICD-10-CM

## 2013-09-07 ENCOUNTER — Other Ambulatory Visit: Payer: Self-pay

## 2013-09-07 DIAGNOSIS — Z1231 Encounter for screening mammogram for malignant neoplasm of breast: Secondary | ICD-10-CM

## 2013-09-19 ENCOUNTER — Ambulatory Visit
Admission: RE | Admit: 2013-09-19 | Discharge: 2013-09-19 | Disposition: A | Discharge status: Home / Self Care | Payer: BC Managed Care – PPO | Source: Ambulatory Visit

## 2013-09-19 DIAGNOSIS — Z1231 Encounter for screening mammogram for malignant neoplasm of breast: Secondary | ICD-10-CM

## 2014-06-04 ENCOUNTER — Emergency Department (HOSPITAL_COMMUNITY)
Admission: EM | Admit: 2014-06-04 | Discharge: 2014-06-05 | Disposition: A | Discharge status: Home / Self Care | Payer: BLUE CROSS/BLUE SHIELD | Attending: Emergency Medicine | Admitting: Emergency Medicine

## 2014-06-04 ENCOUNTER — Emergency Department (HOSPITAL_COMMUNITY): Payer: BLUE CROSS/BLUE SHIELD

## 2014-06-04 ENCOUNTER — Encounter (HOSPITAL_COMMUNITY): Payer: Self-pay | Admitting: *Deleted

## 2014-06-04 DIAGNOSIS — R109 Unspecified abdominal pain: Secondary | ICD-10-CM

## 2014-06-04 DIAGNOSIS — K921 Melena: Secondary | ICD-10-CM

## 2014-06-04 DIAGNOSIS — J45909 Unspecified asthma, uncomplicated: Secondary | ICD-10-CM | POA: Insufficient documentation

## 2014-06-04 DIAGNOSIS — Z8669 Personal history of other diseases of the nervous system and sense organs: Secondary | ICD-10-CM | POA: Diagnosis not present

## 2014-06-04 DIAGNOSIS — Z8619 Personal history of other infectious and parasitic diseases: Secondary | ICD-10-CM | POA: Diagnosis not present

## 2014-06-04 DIAGNOSIS — Z8739 Personal history of other diseases of the musculoskeletal system and connective tissue: Secondary | ICD-10-CM | POA: Diagnosis not present

## 2014-06-04 DIAGNOSIS — E079 Disorder of thyroid, unspecified: Secondary | ICD-10-CM | POA: Diagnosis not present

## 2014-06-04 DIAGNOSIS — Z862 Personal history of diseases of the blood and blood-forming organs and certain disorders involving the immune mechanism: Secondary | ICD-10-CM | POA: Insufficient documentation

## 2014-06-04 DIAGNOSIS — K625 Hemorrhage of anus and rectum: Secondary | ICD-10-CM | POA: Diagnosis present

## 2014-06-04 DIAGNOSIS — Z8742 Personal history of other diseases of the female genital tract: Secondary | ICD-10-CM | POA: Diagnosis not present

## 2014-06-04 DIAGNOSIS — Z79899 Other long term (current) drug therapy: Secondary | ICD-10-CM | POA: Insufficient documentation

## 2014-06-04 DIAGNOSIS — Z3202 Encounter for pregnancy test, result negative: Secondary | ICD-10-CM | POA: Insufficient documentation

## 2014-06-04 LAB — COMPREHENSIVE METABOLIC PANEL
ALT: 31 U/L (ref 0–35)
AST: 30 U/L (ref 0–37)
Albumin: 3.6 g/dL (ref 3.5–5.2)
Alkaline Phosphatase: 73 U/L (ref 39–117)
Anion gap: 8 (ref 5–15)
BUN: 7 mg/dL (ref 6–23)
CALCIUM: 8.7 mg/dL (ref 8.4–10.5)
CHLORIDE: 103 mmol/L (ref 96–112)
CO2: 24 mmol/L (ref 19–32)
CREATININE: 0.92 mg/dL (ref 0.50–1.10)
GFR calc Af Amer: 85 mL/min — ABNORMAL LOW (ref >90)
GFR, EST NON AFRICAN AMERICAN: 73 mL/min — AB (ref >90)
GLUCOSE: 112 mg/dL — AB (ref 70–99)
POTASSIUM: 3.3 mmol/L — AB (ref 3.5–5.1)
SODIUM: 135 mmol/L (ref 135–145)
TOTAL PROTEIN: 7.7 g/dL (ref 6.0–8.3)
Total Bilirubin: 0.4 mg/dL (ref 0.3–1.2)

## 2014-06-04 LAB — CBC WITH DIFFERENTIAL/PLATELET
BASOS ABS: 0 10*3/uL (ref 0.0–0.1)
BASOS PCT: 0 % (ref 0–1)
EOS ABS: 0 10*3/uL (ref 0.0–0.7)
Eosinophils Relative: 1 % (ref 0–5)
HCT: 36.3 % (ref 36.0–46.0)
HEMOGLOBIN: 11.3 g/dL — AB (ref 12.0–15.0)
LYMPHS ABS: 0.9 10*3/uL (ref 0.7–4.0)
LYMPHS PCT: 11 % — AB (ref 12–46)
MCH: 24.5 pg — ABNORMAL LOW (ref 26.0–34.0)
MCHC: 31.1 g/dL (ref 30.0–36.0)
MCV: 78.7 fL (ref 78.0–100.0)
Monocytes Absolute: 0.3 10*3/uL (ref 0.1–1.0)
Monocytes Relative: 4 % (ref 3–12)
NEUTROS PCT: 84 % — AB (ref 43–77)
Neutro Abs: 7.1 10*3/uL (ref 1.7–7.7)
PLATELETS: 306 10*3/uL (ref 150–400)
RBC: 4.61 MIL/uL (ref 3.87–5.11)
RDW: 15.9 % — AB (ref 11.5–15.5)
WBC: 8.4 10*3/uL (ref 4.0–10.5)

## 2014-06-04 LAB — POC URINE PREG, ED: Preg Test, Ur: NEGATIVE

## 2014-06-04 LAB — LIPASE, BLOOD: LIPASE: 20 U/L (ref 11–59)

## 2014-06-04 MED ORDER — HYDROMORPHONE HCL 1 MG/ML IJ SOLN
1.0000 mg | Freq: Once | INTRAMUSCULAR | Status: AC
  Administered 2014-06-04: 1 mg via INTRAVENOUS
  Filled 2014-06-04: qty 1

## 2014-06-04 MED ORDER — SODIUM CHLORIDE 0.9 % IV BOLUS (SEPSIS)
1000.0000 mL | Freq: Once | INTRAVENOUS | Status: AC
  Administered 2014-06-04: 1000 mL via INTRAVENOUS

## 2014-06-04 MED ORDER — ONDANSETRON 4 MG PO TBDP: ORAL_TABLET | ORAL | Status: DC

## 2014-06-04 MED ORDER — IOHEXOL 300 MG/ML  SOLN
100.0000 mL | Freq: Once | INTRAMUSCULAR | Status: AC | PRN
  Administered 2014-06-04: 100 mL via INTRAVENOUS

## 2014-06-04 MED ORDER — IOHEXOL 300 MG/ML  SOLN
25.0000 mL | INTRAMUSCULAR | Status: AC
  Administered 2014-06-04: 25 mL via ORAL

## 2014-06-04 MED ORDER — ONDANSETRON HCL 4 MG/2ML IJ SOLN
4.0000 mg | Freq: Once | INTRAMUSCULAR | Status: AC
  Administered 2014-06-04: 4 mg via INTRAVENOUS
  Filled 2014-06-04: qty 2

## 2014-06-04 NOTE — ED Provider Notes (Signed)
CSN: [id number]     Arrival date & time 06/04/14  2002 History   First MD Initiated Contact with Patient 06/04/14 2041     Chief Complaint  Patient presents with  . Rectal Bleeding     (Consider location/radiation/quality/duration/timing/severity/associated sxs/prior Treatment) Patient is a 49 y.o. female presenting with abdominal pain.  Abdominal Pain Pain location:  Suprapubic Pain quality: sharp   Pain radiates to:  R flank Pain severity:  Severe Onset quality:  Gradual Duration:  1 day Timing:  Constant Progression:  Worsening Chronicity:  New Context: not previous surgeries and not recent illness   Relieved by:  Nothing Worsened by:  Palpation, vomiting and movement Ineffective treatments:  None tried Associated symptoms: anorexia, diarrhea, hematochezia, nausea and vomiting   Associated symptoms: no chest pain, no chills, no cough, no dysuria, no fatigue, no fever, no hematuria, no shortness of breath, no vaginal bleeding and no vaginal discharge     Past Medical History  Diagnosis Date  . Back spasm   . History of measles, mumps, or rubella   . History of chicken pox   . Asthma   . H/O varicose veins   . H/O bladder infections   . Anemia   . H/O insomnia 2012  . H/O vaginitis 2012  . PMB (postmenopausal bleeding)   . Endometrial polyp 2011  . Fibroid   . H/O amenorrhea   . Menopausal symptoms   . Vaginal irritation 12/25/2010    itching also  . Hepatitis B 1988 & 1989  . Thyroid disease    Past Surgical History  Procedure Laterality Date  . Wisdom tooth extraction     Family History  Problem Relation Age of Onset  . Hypertension Maternal Uncle    History  Substance Use Topics  . Smoking status: Never Smoker   . Smokeless tobacco: Not on file  . Alcohol Use: No   OB History    No data available     Review of Systems  Constitutional: Negative for fever, chills and fatigue.  Respiratory: Negative for cough and shortness of breath.    Cardiovascular: Negative for chest pain.  Gastrointestinal: Positive for nausea, vomiting, abdominal pain, diarrhea, hematochezia and anorexia.  Genitourinary: Negative for dysuria, hematuria, vaginal bleeding and vaginal discharge.  All other systems reviewed and are negative.     Allergies  Codeine  Home Medications   Prior to Admission medications   Medication Sig Start Date End Date Taking? Authorizing Provider  cyclobenzaprine (FLEXERIL) 5 MG tablet Take 5 mg by mouth 3 (three) times daily as needed for muscle spasms.  05/15/14  Yes Historical Provider, MD  HYDROcodone-acetaminophen (NORCO/VICODIN) 5-325 MG per tablet Take 1 tablet by mouth every 6 (six) hours as needed for severe pain.  05/15/14  Yes Historical Provider, MD  ibuprofen (ADVIL,MOTRIN) 800 MG tablet Take 800 mg by mouth daily as needed for moderate pain.  05/15/14  Yes Historical Provider, MD  levothyroxine (SYNTHROID, LEVOTHROID) 100 MCG tablet Take 100 mcg by mouth daily. 03/04/14  Yes Historical Provider, MD  ibuprofen (ADVIL,MOTRIN) 600 MG tablet Take 1 tablet (600 mg total) by mouth once. 02/17/12   Garald Balding, NP  ondansetron (ZOFRAN ODT) 4 MG disintegrating tablet 4mg  ODT q4 hours prn nausea/vomit 06/04/14   Debby Freiberg, MD   BP 100/61 mmHg  Pulse 63  Temp(Src) 98.1 F (36.7 C) (Oral)  Resp 16  Ht 5\' 9"  (1.753 m)  Wt 218 lb (98.884 kg)  BMI 32.18 kg/m2  SpO2 100%  LMP 02/27/2011 Physical Exam  Constitutional: She is oriented to person, place, and time. She appears well-developed and well-nourished.  HENT:  Head: Normocephalic and atraumatic.  Right Ear: External ear normal.  Left Ear: External ear normal.  Eyes: Conjunctivae and EOM are normal. Pupils are equal, round, and reactive to light.  Neck: Normal range of motion. Neck supple.  Cardiovascular: Normal rate, regular rhythm, normal heart sounds and intact distal pulses.   Pulmonary/Chest: Effort normal and breath sounds normal.  Abdominal:  Soft. Bowel sounds are normal. There is tenderness in the right lower quadrant, suprapubic area and left lower quadrant.  Musculoskeletal: Normal range of motion.  Neurological: She is alert and oriented to person, place, and time.  Skin: Skin is warm and dry.  Vitals reviewed.   ED Course  Procedures (including critical care time) Labs Review Labs Reviewed  CBC WITH DIFFERENTIAL/PLATELET - Abnormal; Notable for the following:    Hemoglobin 11.3 (*)    MCH 24.5 (*)    RDW 15.9 (*)    Neutrophils Relative % 84 (*)    Lymphocytes Relative 11 (*)    All other components within normal limits  COMPREHENSIVE METABOLIC PANEL - Abnormal; Notable for the following:    Potassium 3.3 (*)    Glucose, Bld 112 (*)    GFR calc non Af Amer 73 (*)    GFR calc Af Amer 85 (*)    All other components within normal limits  URINALYSIS, ROUTINE W REFLEX MICROSCOPIC - Abnormal; Notable for the following:    Hgb urine dipstick TRACE (*)    All other components within normal limits  LIPASE, BLOOD  URINE MICROSCOPIC-ADD ON  POC URINE PREG, ED    Imaging Review Ct Abdomen Pelvis W Contrast  06/04/2014   CLINICAL DATA:  Lower abdominal pain. Bright red blood in stools this morning.  EXAM: CT ABDOMEN AND PELVIS WITH CONTRAST  TECHNIQUE: Multidetector CT imaging of the abdomen and pelvis was performed using the standard protocol following bolus administration of intravenous contrast.  CONTRAST:  1 OMNIPAQUE IOHEXOL 300 MG/ML SOLN, 152mL OMNIPAQUE IOHEXOL 300 MG/ML SOLN  COMPARISON:  None.  FINDINGS: Atelectasis in the lung bases.  Single stone in the gallbladder. No gallbladder wall thickening. No bile duct dilatation. Liver, spleen, pancreas, adrenal glands, kidneys, abdominal aorta, inferior vena cava, and retroperitoneal lymph nodes are unremarkable. Stomach, small bowel, and colon are not abnormally distended. No wall thickening is appreciated. No free air or free fluid in the abdomen. Abdominal wall  musculature appears intact.  Pelvis: Appendix is normal. Uterus and ovaries are not enlarged. Bladder wall is not thickened. No free or loculated pelvic fluid collections. No pelvic mass or lymphadenopathy. No destructive bone lesions.  IMPRESSION: Cholelithiasis with single stone in the gallbladder. No inflammatory changes. No acute process demonstrated in the abdomen or pelvis.   Electronically Signed   By: Lucienne Capers M.D.   On: 06/04/2014 23:33     EKG Interpretation None      MDM   Final diagnoses:  Abdominal pain  Hematochezia    49 y.o. female with pertinent PMH of prior back spasm, known fibroids presents with n/v/d and abd pain since yesterday.  Pain is crampy and associated with vomiting and diarreha.  No fevers.  On arrival vitals and physical exam as above.    Wu unremarkable.  No bleeding on rectal exam.  No stool for hemoccult sample.  Likely gastroenteritis with anal fissure as source of bleeding.  CT scan ordered.  Pt care to Dr. Christy Gentles pending dispo with plan to dc home  I have reviewed all laboratory and imaging studies if ordered as above  1. Hematochezia   2. Abdominal pain         Debby Freiberg, MD 06/06/14 [phone]

## 2014-06-04 NOTE — ED Notes (Signed)
THE PT SAW SOME BRIGHT RED BLOOD IN HER STOOLS THIS AM.  NO KNOWN HEMORRHOIDS.  No previous history.  .  Chronic back pain.  She had ibu and vicodin last pm for the back pain

## 2014-06-04 NOTE — Discharge Instructions (Signed)
Abdominal Pain Many things can cause abdominal pain. Usually, abdominal pain is not caused by a disease and will improve without treatment. It can often be observed and treated at home. Your health care provider will do a physical exam and possibly order blood tests and X-rays to help determine the seriousness of your pain. However, in many cases, more time must pass before a clear cause of the pain can be found. Before that point, your health care provider may not know if you need more testing or further treatment. HOME CARE INSTRUCTIONS  Monitor your abdominal pain for any changes. The following actions may help to alleviate any discomfort you are experiencing:  Only take over-the-counter or prescription medicines as directed by your health care provider.  Do not take laxatives unless directed to do so by your health care provider.  Try a clear liquid diet (broth, tea, or water) as directed by your health care provider. Slowly move to a bland diet as tolerated. SEEK MEDICAL CARE IF:  You have unexplained abdominal pain.  You have abdominal pain associated with nausea or diarrhea.  You have pain when you urinate or have a bowel movement.  You experience abdominal pain that wakes you in the night.  You have abdominal pain that is worsened or improved by eating food.  You have abdominal pain that is worsened with eating fatty foods.  You have a fever. SEEK IMMEDIATE MEDICAL CARE IF:   Your pain does not go away within 2 hours.  You keep throwing up (vomiting).  Your pain is felt only in portions of the abdomen, such as the right side or the left lower portion of the abdomen.  You pass bloody or black tarry stools. MAKE SURE YOU:  Understand these instructions.   Will watch your condition.   Will get help right away if you are not doing well or get worse.  Document Released: 01/08/2005 Document Revised: 04/05/2013 Document Reviewed: 12/08/2012 [facility] Patient Information  2015 [place], [place]. This information is not intended to replace advice given to you by your health care provider. Make sure you discuss any questions you have with your health care provider.  Bloody Diarrhea Bloody diarrhea can be caused by many different conditions. Most of the time bloody diarrhea is the result of food poisoning or minor infections. Bloody diarrhea usually improves over 2 to 3 days of rest and fluid replacement. Other conditions that can cause bloody diarrhea include:  Internal bleeding.  Infection.  Diseases of the bowel and colon. Internal bleeding from an ulcer or bowel disease can be severe and requires hospital care or even surgery. DIAGNOSIS  To find out what is wrong your caregiver may check your:  Stool.  Blood.  Results from a test that looks inside the body (endoscopy). TREATMENT   Get plenty of rest.  Drink enough water and fluids to keep your urine clear or pale yellow.  Do not smoke.  Solid foods and dairy products should be avoided until your illness improves.  As you improve, slowly return to a regular diet with easily-digested foods first. Examples are:  Bananas.  Rice.  Toast.  Crackers. You should only need these for about 2 days before adding more normal foods to your diet.  Avoid spicy or fatty foods as well as caffeine and alcohol for several days.  Medicine to control cramping and diarrhea can relieve symptoms but may prolong some cases of bloody diarrhea. Antibiotics can speed recovery from diarrhea due to some bacterial infections.  Call your caregiver if diarrhea does not get better in 3 days. SEEK MEDICAL CARE IF:   You do not improve after 3 days.  Your diarrhea improves but your stool appears black. SEEK IMMEDIATE MEDICAL CARE IF:   You become extremely weak or faint.  You become very sweaty.  You have increased pain or bleeding.  You develop repeated vomiting.  You vomit and you see blood or the vomit looks  black in color.  You have a fever. Document Released: 03/31/2005 Document Revised: 06/23/2011 Document Reviewed: 03/02/2009 [facility] Patient Information 2015 [place], [place]. This information is not intended to replace advice given to you by your health care provider. Make sure you discuss any questions you have with your health care provider.

## 2014-06-05 LAB — URINALYSIS, ROUTINE W REFLEX MICROSCOPIC
Bilirubin Urine: NEGATIVE
GLUCOSE, UA: NEGATIVE mg/dL
KETONES UR: NEGATIVE mg/dL
Leukocytes, UA: NEGATIVE
NITRITE: NEGATIVE
PROTEIN: NEGATIVE mg/dL
Specific Gravity, Urine: 1.015 (ref 1.005–1.030)
UROBILINOGEN UA: 0.2 mg/dL (ref 0.0–1.0)
pH: 5.5 (ref 5.0–8.0)

## 2014-06-05 LAB — URINE MICROSCOPIC-ADD ON

## 2014-06-05 MED ORDER — OXYCODONE-ACETAMINOPHEN 5-325 MG PO TABS
2.0000 | ORAL_TABLET | Freq: Once | ORAL | Status: AC
  Administered 2014-06-05: 2 via ORAL
  Filled 2014-06-05: qty 2

## 2014-06-05 NOTE — ED Provider Notes (Signed)
I assumed care at signout to f/u urine testing No uti Pt improved abd soft to palpation Will d/c home Advised of cholelithiasis and may need outpatient surgical consultation   Sharyon Cable, MD 06/05/14 0110

## 2014-11-06 ENCOUNTER — Other Ambulatory Visit: Payer: Self-pay

## 2014-11-06 DIAGNOSIS — Z1231 Encounter for screening mammogram for malignant neoplasm of breast: Secondary | ICD-10-CM

## 2014-11-07 ENCOUNTER — Ambulatory Visit
Admission: RE | Admit: 2014-11-07 | Discharge: 2014-11-07 | Disposition: A | Discharge status: Home / Self Care | Payer: BLUE CROSS/BLUE SHIELD | Source: Ambulatory Visit

## 2014-11-07 DIAGNOSIS — Z1231 Encounter for screening mammogram for malignant neoplasm of breast: Secondary | ICD-10-CM

## 2015-07-30 IMAGING — CT CT ABD-PELV W/ CM
2 of 5 series · 16 of 46 positions shown, 18 images · IV contrast (Omni 300)
Comparison: None.

CLINICAL DATA: Lower abdominal pain. Bright red blood in stools
this morning.

EXAM:
CT ABDOMEN AND PELVIS WITH CONTRAST
TECHNIQUE: Multidetector CT imaging of the abdomen and pelvis was performed
using the standard protocol following bolus administration of
intravenous contrast.
CONTRAST:  1 OMNIPAQUE IOHEXOL 300 MG/ML SOLN, 100mL OMNIPAQUE
IOHEXOL 300 MG/ML SOLN

[Series 2: abd/ pelvis 5.0 i30f 1 · axial · 0.76mm/px · z∈[-438,-4]mm · 13 of 97 slices shown, 15 images]
[im 5/97  soft-tissue]
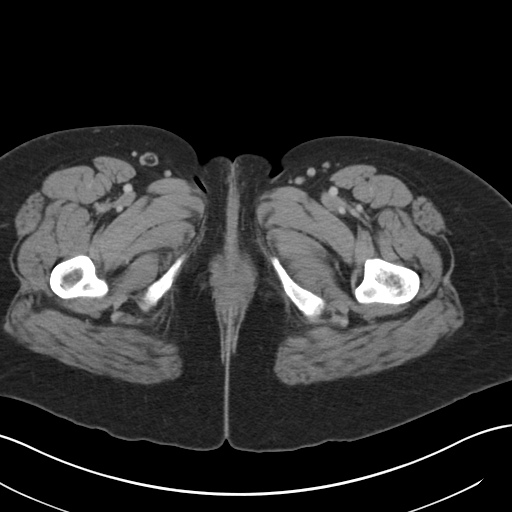
[im 5/97  bone]
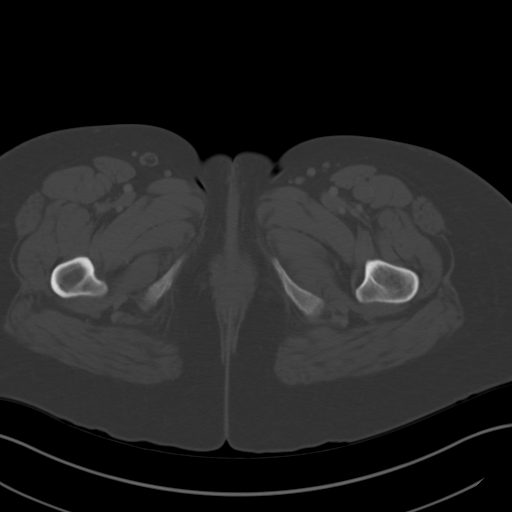
[im 15/97  soft-tissue]
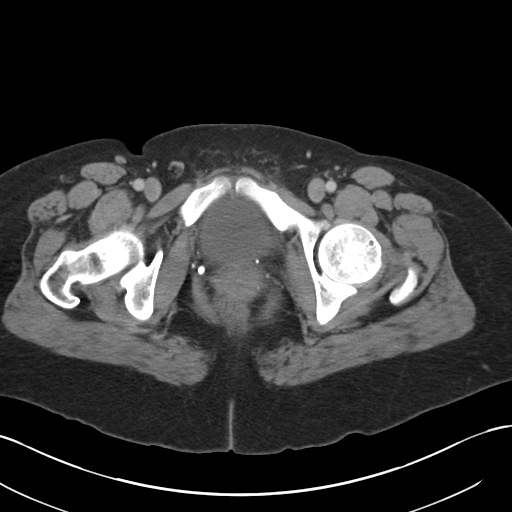
[im 20/97  soft-tissue]
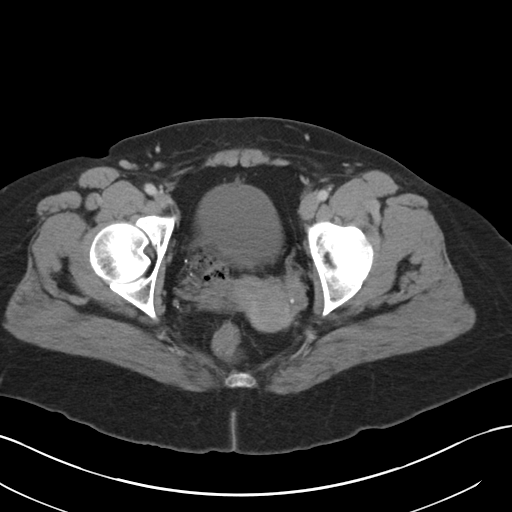
[im 29/97  soft-tissue]
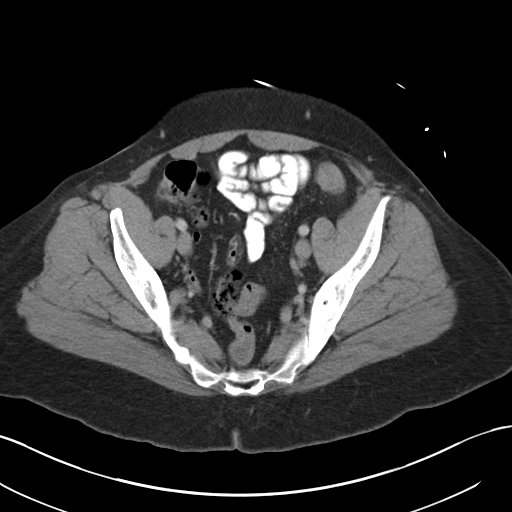
[im 34/97  soft-tissue]
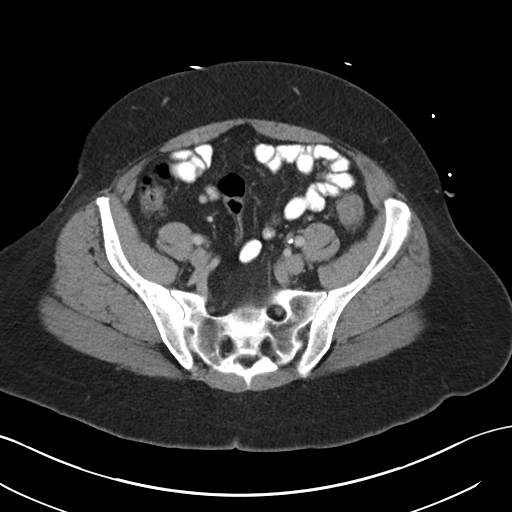
[im 44/97  soft-tissue]
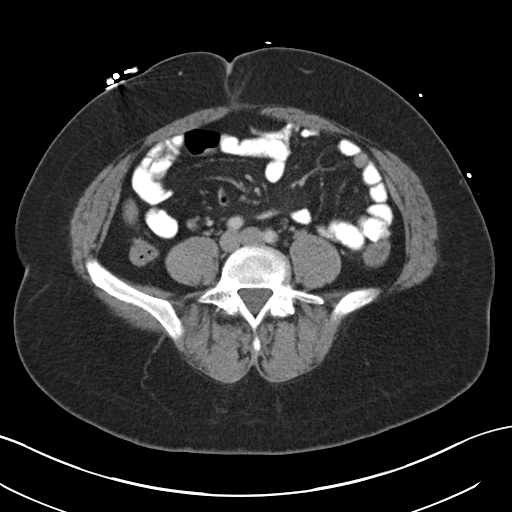
[im 49/97  soft-tissue]
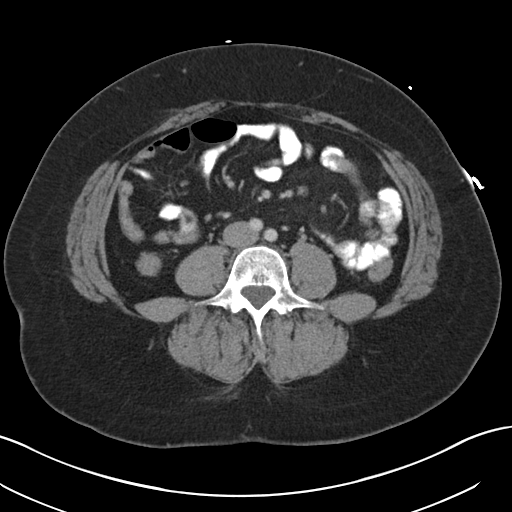
[im 53/97  soft-tissue]
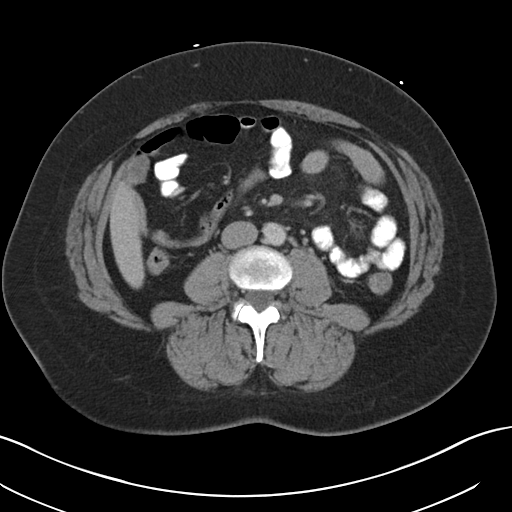
[im 63/97  soft-tissue]
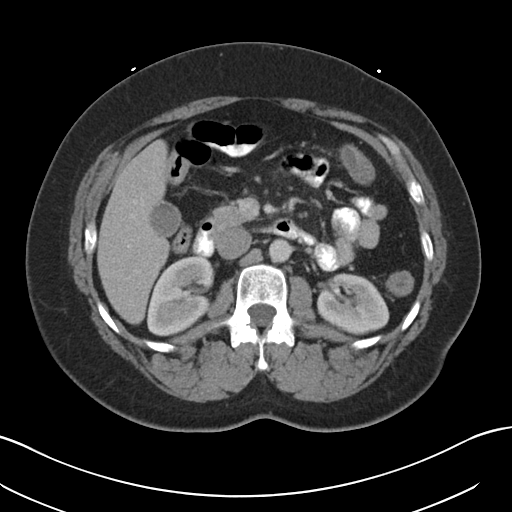
[im 63/97  bone]
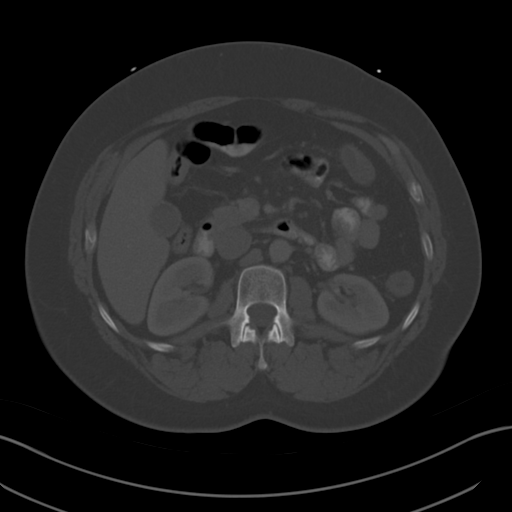
[im 68/97  soft-tissue]
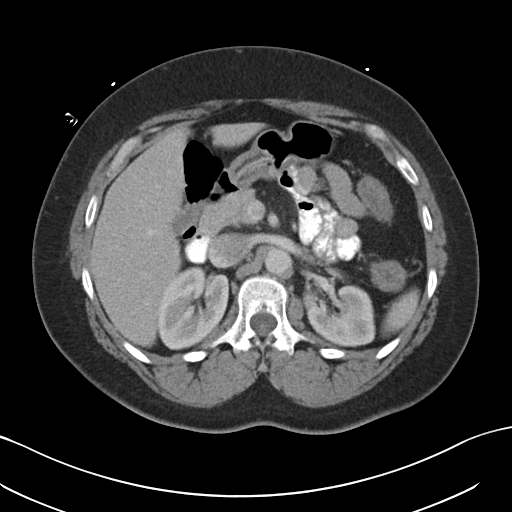
[im 77/97  soft-tissue]
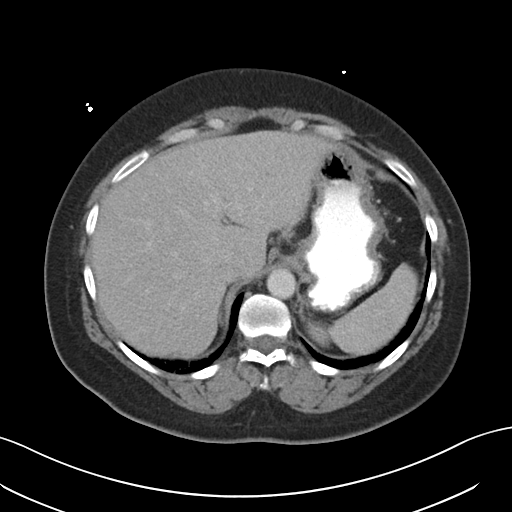
[im 82/97  soft-tissue]
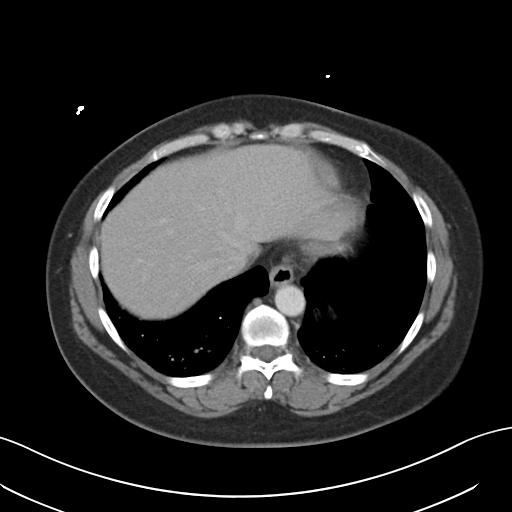
[im 92/97  soft-tissue]
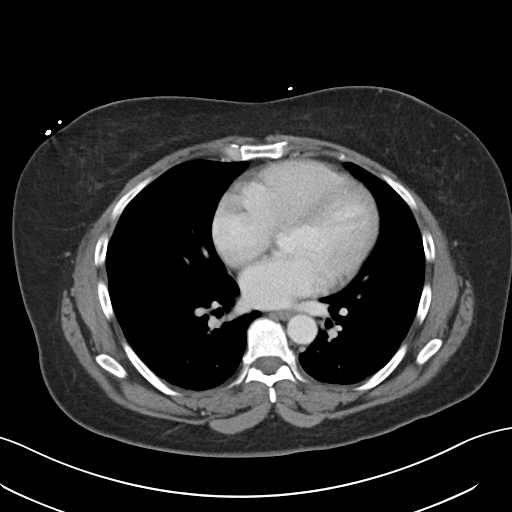

[Series 5: coronals · coronal · 0.70mm/px · 3 of 161 slices shown]
[im 54/161  soft-tissue]
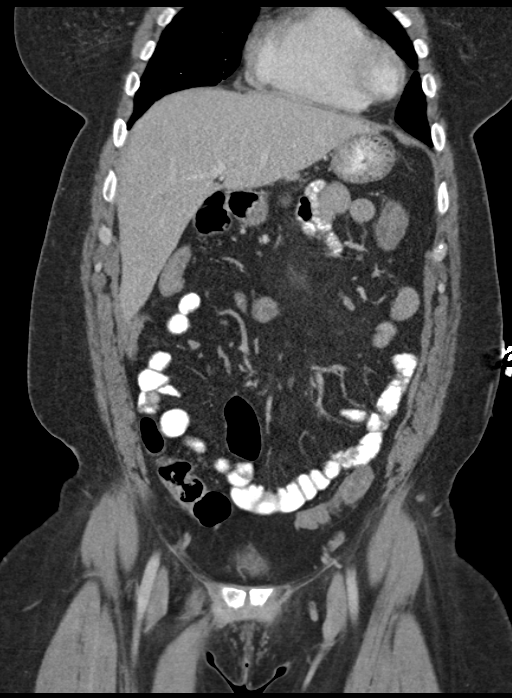
[im 72/161  soft-tissue]
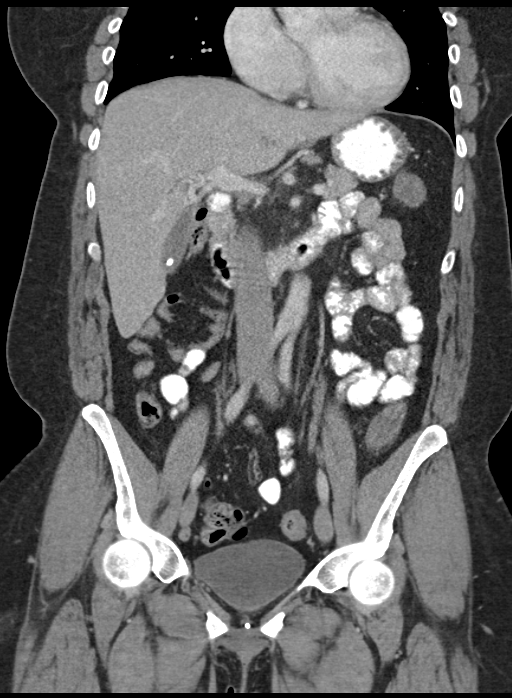
[im 89/161  soft-tissue]
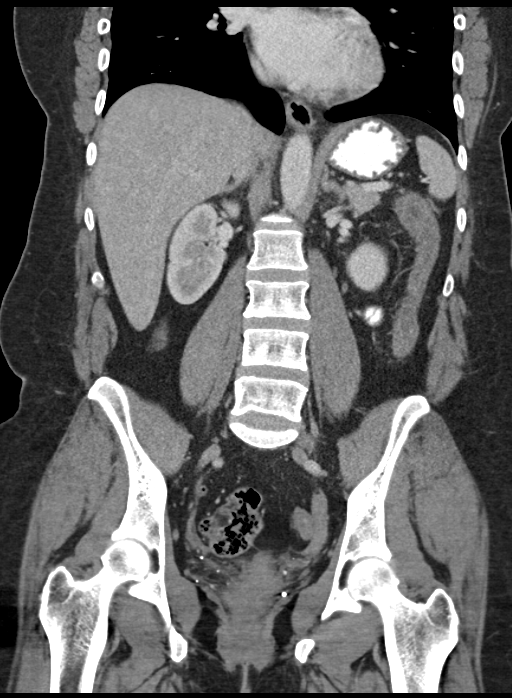

[16 of 46 positions shown; findings below may reference images not displayed]

FINDINGS: Atelectasis in the lung bases.

Single stone in the gallbladder. No gallbladder wall thickening. No
bile duct dilatation. Liver, spleen, pancreas, adrenal glands,
kidneys, abdominal aorta, inferior vena cava, and retroperitoneal
lymph nodes are unremarkable. Stomach, small bowel, and colon are
not abnormally distended. No wall thickening is appreciated. No free
air or free fluid in the abdomen. Abdominal wall musculature appears
intact.

Pelvis: Appendix is normal. Uterus and ovaries are not enlarged.
Bladder wall is not thickened. No free or loculated pelvic fluid
collections. No pelvic mass or lymphadenopathy. No destructive bone
lesions.
IMPRESSION: Cholelithiasis with single stone in the gallbladder. No inflammatory
changes. No acute process demonstrated in the abdomen or pelvis.

## 2015-11-05 ENCOUNTER — Other Ambulatory Visit: Payer: Self-pay | Admitting: Family Medicine

## 2015-11-05 DIAGNOSIS — Z1231 Encounter for screening mammogram for malignant neoplasm of breast: Secondary | ICD-10-CM

## 2015-11-14 ENCOUNTER — Ambulatory Visit
Admission: RE | Admit: 2015-11-14 | Discharge: 2015-11-14 | Disposition: A | Discharge status: Home / Self Care | Payer: BLUE CROSS/BLUE SHIELD | Source: Ambulatory Visit | Attending: Family Medicine | Admitting: Family Medicine

## 2015-11-14 DIAGNOSIS — Z1231 Encounter for screening mammogram for malignant neoplasm of breast: Secondary | ICD-10-CM

## 2017-05-06 ENCOUNTER — Other Ambulatory Visit: Payer: Self-pay | Admitting: Obstetrics and Gynecology

## 2017-05-06 DIAGNOSIS — Z1231 Encounter for screening mammogram for malignant neoplasm of breast: Secondary | ICD-10-CM

## 2020-12-14 ENCOUNTER — Ambulatory Visit (INDEPENDENT_AMBULATORY_CARE_PROVIDER_SITE_OTHER): Payer: PRIVATE HEALTH INSURANCE

## 2020-12-14 ENCOUNTER — Other Ambulatory Visit: Payer: Self-pay

## 2020-12-14 ENCOUNTER — Ambulatory Visit (INDEPENDENT_AMBULATORY_CARE_PROVIDER_SITE_OTHER): Payer: PRIVATE HEALTH INSURANCE | Admitting: Podiatry

## 2020-12-14 DIAGNOSIS — M79672 Pain in left foot: Secondary | ICD-10-CM | POA: Diagnosis not present

## 2020-12-14 DIAGNOSIS — M79671 Pain in right foot: Secondary | ICD-10-CM

## 2020-12-14 DIAGNOSIS — M779 Enthesopathy, unspecified: Secondary | ICD-10-CM

## 2020-12-14 DIAGNOSIS — M722 Plantar fascial fibromatosis: Secondary | ICD-10-CM

## 2020-12-20 ENCOUNTER — Other Ambulatory Visit: Payer: Self-pay | Admitting: Podiatry

## 2020-12-20 DIAGNOSIS — M779 Enthesopathy, unspecified: Secondary | ICD-10-CM

## 2020-12-21 ENCOUNTER — Encounter: Payer: Self-pay | Admitting: Podiatry

## 2020-12-21 NOTE — Progress Notes (Signed)
Subjective:  Patient ID: Brenda Montoya, female    DOB: April 28, 1965,  MRN: [id number]  Chief Complaint  Patient presents with   Foot Pain    Bilateral foot pain located at the arch or both feet for year. Sharp burning pains without any association with edema, tingling or numbness.     55 y.o. female presents with the above complaint.  Patient presents with complaint of bilateral plantar arch pain.  Patient states it hurts right in the midfoot part of the plantar fascia with ambulation.  Patient states that there is no injury no numbness no edema.  Is been going on for years has progressed to gotten worse.  She wanted to get it evaluated she has not seen anyone else prior to seeing me.  She denies any other acute complaints.  She is never got a steroid injection.  She wears regular shoes.  She is constantly on her foot a lot.  She denies any other acute complaints.   Review of Systems: Negative except as noted in the HPI. Denies N/V/F/Ch.  Past Medical History:  Diagnosis Date   Anemia    Asthma    Back spasm    Endometrial polyp 2011   Fibroid    H/O amenorrhea    H/O bladder infections    H/O insomnia 2012   H/O vaginitis 2012   H/O varicose veins    Hepatitis B 1988 & 1989   History of chicken pox    History of measles, mumps, or rubella    Menopausal symptoms    PMB (postmenopausal bleeding)    Thyroid disease    Vaginal irritation 12/25/2010   itching also    Current Outpatient Medications:    albuterol (VENTOLIN HFA) 108 (90 Base) MCG/ACT inhaler, albuterol sulfate HFA 90 mcg/actuation aerosol inhaler  INHALE 2 PUFFS BY MOUTH EVERY 6 TO 8 HOURS AS NEEDED, Disp: , Rfl:    atenolol (TENORMIN) 50 MG tablet, atenolol 50 mg tablet, Disp: , Rfl:    atorvastatin (LIPITOR) 20 MG tablet, atorvastatin 20 mg tablet  TAKE 1 TABLET BY MOUTH AT BEDTIME, Disp: , Rfl:    ibuprofen (ADVIL,MOTRIN) 800 MG tablet, Take 800 mg by mouth daily as needed for moderate pain. , Disp: , Rfl: 1    levothyroxine (SYNTHROID) 112 MCG tablet, levothyroxine 112 mcg tablet  TAKE 1 TABLET BY MOUTH DAILY FOR THYROID, Disp: , Rfl:   Social History   Tobacco Use  Smoking Status Never  Smokeless Tobacco Not on file    Allergies  Allergen Reactions   Codeine Nausea And Vomiting   Objective:  There were no vitals filed for this visit. There is no height or weight on file to calculate BMI. Constitutional Well developed. Well nourished.  Vascular Dorsalis pedis pulses palpable bilaterally. Posterior tibial pulses palpable bilaterally. Capillary refill normal to all digits.  No cyanosis or clubbing noted. Pedal hair growth normal.  Neurologic Normal speech. Oriented to person, place, and time. Epicritic sensation to light touch grossly present bilaterally.  Dermatologic Nails well groomed and normal in appearance. No open wounds. No skin lesions.  Orthopedic: Normal joint ROM without pain or crepitus bilaterally. No visible deformities. Tender to palpation at the plantar midfoot bilaterally.  Tight plantar fashion noted. No pain with calcaneal squeeze bilaterally. Ankle ROM diminished range of motion bilaterally. Silfverskiold Test: positive bilaterally.   Radiographs: Taken and reviewed. No acute fractures or dislocations. No evidence of stress fracture.  Plantar heel spur present. Posterior heel spur present.  Midfoot arthritis noted.  Assessment:   1. Plantar fasciitis, right   2. Plantar fasciitis, left    Plan:  Patient was evaluated and treated and all questions answered.  Plantar Fasciitis plantar midfoot central band, bilaterally - XR reviewed as above.  - Educated on icing and stretching. Instructions given.  -Now injection delivered to the plantar fascia  - DME: Plantar Fascial Brace x2 - Pharmacologic management: None  Pes planovalgus -I explained to patient the etiology of pes planovalgus and relationship with Planter fasciitis and various treatment options  were discussed.  Given patient foot structure in the setting of Planter fasciitis I believe patient will benefit from custom-made orthotics to help control the hindfoot motion support the arch of the foot and take the stress away from plantar fascial.  Patient agrees with the plan like to proceed with orthotics -Patient was casted for orthotics   No follow-ups on file.

## 2020-12-28 ENCOUNTER — Ambulatory Visit: Payer: PRIVATE HEALTH INSURANCE | Admitting: Podiatry

## 2021-01-16 ENCOUNTER — Ambulatory Visit (INDEPENDENT_AMBULATORY_CARE_PROVIDER_SITE_OTHER): Payer: PRIVATE HEALTH INSURANCE | Admitting: Podiatry

## 2021-01-16 ENCOUNTER — Other Ambulatory Visit: Payer: Self-pay

## 2021-01-16 DIAGNOSIS — M722 Plantar fascial fibromatosis: Secondary | ICD-10-CM | POA: Diagnosis not present

## 2021-01-24 NOTE — Progress Notes (Signed)
Subjective:  Patient ID: Brenda Montoya, female    DOB: Nov 17, 1965,  MRN: [id number]  No chief complaint on file.   55 y.o. female presents with the above complaint.  Patient presents with follow-up of bilateral plantar fasciitis.  She states that the left side is completely healed on the right side is bothersome.  The injection helped considerably the bracing helped considerably.  She would like to discuss her treatment options for right side now.  She denies any other acute complaints   Review of Systems: Negative except as noted in the HPI. Denies N/V/F/Ch.  Past Medical History:  Diagnosis Date   Anemia    Asthma    Back spasm    Endometrial polyp 2011   Fibroid    H/O amenorrhea    H/O bladder infections    H/O insomnia 2012   H/O vaginitis 2012   H/O varicose veins    Hepatitis B 1988 & 1989   History of chicken pox    History of measles, mumps, or rubella    Menopausal symptoms    PMB (postmenopausal bleeding)    Thyroid disease    Vaginal irritation 12/25/2010   itching also    Current Outpatient Medications:    albuterol (VENTOLIN HFA) 108 (90 Base) MCG/ACT inhaler, albuterol sulfate HFA 90 mcg/actuation aerosol inhaler  INHALE 2 PUFFS BY MOUTH EVERY 6 TO 8 HOURS AS NEEDED, Disp: , Rfl:    atenolol (TENORMIN) 50 MG tablet, atenolol 50 mg tablet, Disp: , Rfl:    atorvastatin (LIPITOR) 20 MG tablet, atorvastatin 20 mg tablet  TAKE 1 TABLET BY MOUTH AT BEDTIME, Disp: , Rfl:    ibuprofen (ADVIL,MOTRIN) 800 MG tablet, Take 800 mg by mouth daily as needed for moderate pain. , Disp: , Rfl: 1   levothyroxine (SYNTHROID) 112 MCG tablet, levothyroxine 112 mcg tablet  TAKE 1 TABLET BY MOUTH DAILY FOR THYROID, Disp: , Rfl:   Social History   Tobacco Use  Smoking Status Never  Smokeless Tobacco Not on file    Allergies  Allergen Reactions   Codeine Nausea And Vomiting   Objective:  There were no vitals filed for this visit. There is no height or weight on file to  calculate BMI. Constitutional Well developed. Well nourished.  Vascular Dorsalis pedis pulses palpable bilaterally. Posterior tibial pulses palpable bilaterally. Capillary refill normal to all digits.  No cyanosis or clubbing noted. Pedal hair growth normal.  Neurologic Normal speech. Oriented to person, place, and time. Epicritic sensation to light touch grossly present bilaterally.  Dermatologic Nails well groomed and normal in appearance. No open wounds. No skin lesions.  Orthopedic: Normal joint ROM without pain or crepitus bilaterally. No visible deformities. Tender to palpation at the plantar midfoot bilaterally.  Tight plantar fashion noted. No pain with calcaneal squeeze bilaterally. Ankle ROM diminished range of motion bilaterally. Silfverskiold Test: positive bilaterally.   Radiographs: Taken and reviewed. No acute fractures or dislocations. No evidence of stress fracture.  Plantar heel spur present. Posterior heel spur present.  Midfoot arthritis noted.  Assessment:   1. Plantar fasciitis, right     Plan:  Patient was evaluated and treated and all questions answered.  Left Planter fasciitis -Clinically resolved  Plantar Fasciitis plantar midfoot central band, right - XR reviewed as above.  - Educated on icing and stretching. Instructions given.  -Second injection delivered to the plantar fascia  - DME: Plantar Fascial Brace x2 - Pharmacologic management: None  Pes planovalgus -I explained to patient the  etiology of pes planovalgus and relationship with Planter fasciitis and various treatment options were discussed.  Given patient foot structure in the setting of Planter fasciitis I believe patient will benefit from custom-made orthotics to help control the hindfoot motion support the arch of the foot and take the stress away from plantar fascial.  Patient agrees with the plan like to proceed with orthotics -Orthotics were dispensed   No follow-ups on file.

## 2021-02-13 ENCOUNTER — Other Ambulatory Visit: Payer: Self-pay

## 2021-02-13 ENCOUNTER — Ambulatory Visit (INDEPENDENT_AMBULATORY_CARE_PROVIDER_SITE_OTHER): Payer: PRIVATE HEALTH INSURANCE | Admitting: Podiatry

## 2021-02-13 DIAGNOSIS — M722 Plantar fascial fibromatosis: Secondary | ICD-10-CM

## 2021-02-14 NOTE — Progress Notes (Signed)
Subjective:  Patient ID: Brenda Montoya, female    DOB: [birth year],  MRN: [id number]  Chief Complaint  Patient presents with   Plantar Fasciitis    PT stated that she is doing much better she stated that the brace and orthotics have helped a lot      55 y.o. female presents with the above complaint.  Patient presents with follow-up of bilateral plantar fasciitis.  She states she is doing a lot better now.  She states the brace and orthotics have helped no new concerns.  She wanted to discuss prevention   Review of Systems: Negative except as noted in the HPI. Denies N/V/F/Ch.  Past Medical History:  Diagnosis Date   Anemia    Asthma    Back spasm    Endometrial polyp 2011   Fibroid    H/O amenorrhea    H/O bladder infections    H/O insomnia 2012   H/O vaginitis 2012   H/O varicose veins    Hepatitis B 1988 & 1989   History of chicken pox    History of measles, mumps, or rubella    Menopausal symptoms    PMB (postmenopausal bleeding)    Thyroid disease    Vaginal irritation 12/25/2010   itching also    Current Outpatient Medications:    albuterol (VENTOLIN HFA) 108 (90 Base) MCG/ACT inhaler, albuterol sulfate HFA 90 mcg/actuation aerosol inhaler  INHALE 2 PUFFS BY MOUTH EVERY 6 TO 8 HOURS AS NEEDED, Disp: , Rfl:    atenolol (TENORMIN) 50 MG tablet, atenolol 50 mg tablet, Disp: , Rfl:    atorvastatin (LIPITOR) 20 MG tablet, atorvastatin 20 mg tablet  TAKE 1 TABLET BY MOUTH AT BEDTIME, Disp: , Rfl:    ibuprofen (ADVIL,MOTRIN) 800 MG tablet, Take 800 mg by mouth daily as needed for moderate pain. , Disp: , Rfl: 1   levothyroxine (SYNTHROID) 112 MCG tablet, levothyroxine 112 mcg tablet  TAKE 1 TABLET BY MOUTH DAILY FOR THYROID, Disp: , Rfl:   Social History   Tobacco Use  Smoking Status Never  Smokeless Tobacco Not on file    Allergies  Allergen Reactions   Codeine Nausea And Vomiting   Objective:  There were no vitals filed for this visit. There is no height or  weight on file to calculate BMI. Constitutional Well developed. Well nourished.  Vascular Dorsalis pedis pulses palpable bilaterally. Posterior tibial pulses palpable bilaterally. Capillary refill normal to all digits.  No cyanosis or clubbing noted. Pedal hair growth normal.  Neurologic Normal speech. Oriented to person, place, and time. Epicritic sensation to light touch grossly present bilaterally.  Dermatologic Nails well groomed and normal in appearance. No open wounds. No skin lesions.  Orthopedic: Normal joint ROM without pain or crepitus bilaterally. No visible deformities. Tender to palpation at the plantar midfoot bilaterally.  Tight plantar fashion noted. No pain with calcaneal squeeze bilaterally. Ankle ROM diminished range of motion bilaterally. Silfverskiold Test: positive bilaterally.   Radiographs: Taken and reviewed. No acute fractures or dislocations. No evidence of stress fracture.  Plantar heel spur present. Posterior heel spur present.  Midfoot arthritis noted.  Assessment:   1. Plantar fasciitis, right   2. Plantar fasciitis, left      Plan:  Patient was evaluated and treated and all questions answered.  Left Planter fasciitis -Clinically resolved  Plantar Fasciitis plantar midfoot central band, right -Clinically resolved.  At this time I discussed the importance of continued shoe gear modification and orthotics management.  If her  foot and ankle pain issues arises again I have asked her to come see me immediately.  She states understanding  Pes planovalgus -I explained to patient the etiology of pes planovalgus and relationship with Planter fasciitis and various treatment options were discussed.  Given patient foot structure in the setting of Planter fasciitis I believe patient will benefit from custom-made orthotics to help control the hindfoot motion support the arch of the foot and take the stress away from plantar fascial.  Patient agrees with the  plan like to proceed with orthotics -Orthotics were dispensed   No follow-ups on file.

## 2023-10-28 ENCOUNTER — Ambulatory Visit (INDEPENDENT_AMBULATORY_CARE_PROVIDER_SITE_OTHER): Payer: PRIVATE HEALTH INSURANCE | Admitting: Podiatry

## 2023-10-28 DIAGNOSIS — M62462 Contracture of muscle, left lower leg: Secondary | ICD-10-CM | POA: Diagnosis not present

## 2023-10-28 DIAGNOSIS — M722 Plantar fascial fibromatosis: Secondary | ICD-10-CM | POA: Diagnosis not present

## 2023-10-28 DIAGNOSIS — M62461 Contracture of muscle, right lower leg: Secondary | ICD-10-CM | POA: Diagnosis not present

## 2023-10-28 NOTE — Progress Notes (Signed)
 Subjective:  Patient ID: Brenda Montoya, female    DOB: 1965-10-26,  MRN: [id number]  Chief Complaint  Patient presents with   Foot Pain    Pain in the arches    58 y.o. female presents with the above complaint.  Patient presents with complaint of bilateral plantar arch pain.  She says started coming back on the right side of the plantar midfoot on the left side is the heel.  Wanted get it evaluated she would like to do injection.  She states that it just came out of nowhere is progressive gotten worse.  Pain scale 7 out of 10 dull aching nature   Review of Systems: Negative except as noted in the HPI. Denies N/V/F/Ch.  Past Medical History:  Diagnosis Date   Anemia    Asthma    Back spasm    Endometrial polyp 2011   Fibroid    H/O amenorrhea    H/O bladder infections    H/O insomnia 2012   H/O vaginitis 2012   H/O varicose veins    Hepatitis B 1988 & 1989   History of chicken pox    History of measles, mumps, or rubella    Menopausal symptoms    PMB (postmenopausal bleeding)    Thyroid  disease    Vaginal irritation 12/25/2010   itching also    Current Outpatient Medications:    albuterol  (VENTOLIN  HFA) 108 (90 Base) MCG/ACT inhaler, albuterol  sulfate HFA 90 mcg/actuation aerosol inhaler  INHALE 2 PUFFS BY MOUTH EVERY 6 TO 8 HOURS AS NEEDED, Disp: , Rfl:    atenolol (TENORMIN) 50 MG tablet, atenolol 50 mg tablet, Disp: , Rfl:    atorvastatin (LIPITOR) 20 MG tablet, atorvastatin 20 mg tablet  TAKE 1 TABLET BY MOUTH AT BEDTIME, Disp: , Rfl:    ibuprofen  (ADVIL ,MOTRIN ) 800 MG tablet, Take 800 mg by mouth daily as needed for moderate pain. , Disp: , Rfl: 1   levothyroxine (SYNTHROID) 112 MCG tablet, levothyroxine 112 mcg tablet  TAKE 1 TABLET BY MOUTH DAILY FOR THYROID , Disp: , Rfl:   Social History   Tobacco Use  Smoking Status Never  Smokeless Tobacco Not on file    Allergies  Allergen Reactions   Codeine Nausea And Vomiting   Objective:  There were no vitals  filed for this visit. There is no height or weight on file to calculate BMI. Constitutional Well developed. Well nourished.  Vascular Dorsalis pedis pulses palpable bilaterally. Posterior tibial pulses palpable bilaterally. Capillary refill normal to all digits.  No cyanosis or clubbing noted. Pedal hair growth normal.  Neurologic Normal speech. Oriented to person, place, and time. Epicritic sensation to light touch grossly present bilaterally.  Dermatologic Nails well groomed and normal in appearance. No open wounds. No skin lesions.  Orthopedic: Normal joint ROM without pain or crepitus bilaterally. No visible deformities. Tender to palpation at the plantar midfoot bilaterally.  Tight plantar fashion noted. No pain with calcaneal squeeze bilaterally. Ankle ROM diminished range of motion bilaterally. Silfverskiold Test: positive bilaterally.   Radiographs: Taken and reviewed. No acute fractures or dislocations. No evidence of stress fracture.  Plantar heel spur present. Posterior heel spur present.  Midfoot arthritis noted.  Assessment:   No diagnosis found.  Plan:  Patient was evaluated and treated and all questions answered.  Plantar Fasciitis plantar midfoot central band on the right and left heel, bilaterally with underlying gastrocnemius equinus ~recurrence - XR reviewed as above.  - Educated on icing and stretching. Instructions given.  -  injection delivered to the plantar fascia  - DME: Plantar Fascial Brace x2 - Pharmacologic management: None  Pes planovalgus -I explained to patient the etiology of pes planovalgus and relationship with Planter fasciitis and various treatment options were discussed.  Given patient foot structure in the setting of Planter fasciitis I believe patient will benefit from custom-made orthotics to help control the hindfoot motion support the arch of the foot and take the stress away from plantar fascial.  Patient agrees with the plan like to  proceed with orthotics - Patient has orthotics, she will bring it with her next time and we will reevaluate.  Procedure: Injection Tendon/Ligament Location: Bilateral plantar fascia at the glabrous junction; medial approach. Skin Prep: alcohol Injectate: 0.5 cc 0.5% marcaine plain, 0.5 cc of 1% Lidocaine, 0.5 cc kenalog  10. Disposition: Patient tolerated procedure well. Injection site dressed with a band-aid.  No follow-ups on file.  No follow-ups on file.

## 2023-11-25 ENCOUNTER — Ambulatory Visit: Payer: PRIVATE HEALTH INSURANCE | Admitting: Podiatry

## 2023-11-25 ENCOUNTER — Telehealth: Payer: Self-pay | Admitting: Podiatry

## 2023-11-25 NOTE — Telephone Encounter (Signed)
 I left a message on patients voicemail. She was scheduled in a new patient slot and needs to be rescheduled.

## 2023-12-09 ENCOUNTER — Ambulatory Visit: Payer: PRIVATE HEALTH INSURANCE | Admitting: Podiatry

## 2023-12-23 ENCOUNTER — Ambulatory Visit: Payer: PRIVATE HEALTH INSURANCE | Admitting: Podiatry

## 2024-01-06 ENCOUNTER — Ambulatory Visit (INDEPENDENT_AMBULATORY_CARE_PROVIDER_SITE_OTHER): Payer: PRIVATE HEALTH INSURANCE | Admitting: Podiatry

## 2024-01-06 DIAGNOSIS — Q666 Other congenital valgus deformities of feet: Secondary | ICD-10-CM | POA: Diagnosis not present

## 2024-01-06 DIAGNOSIS — M76821 Posterior tibial tendinitis, right leg: Secondary | ICD-10-CM

## 2024-01-06 DIAGNOSIS — M76822 Posterior tibial tendinitis, left leg: Secondary | ICD-10-CM | POA: Diagnosis not present

## 2024-01-06 NOTE — Progress Notes (Signed)
 Subjective:  Patient ID: Brenda Montoya, female    DOB: 26-Dec-1965,  MRN: [id number]  Chief Complaint  Patient presents with   Plantar Fasciitis    Plantar fasciitis pt stated that she is still having pain in the arch     58 y.o. female presents with the above complaint.  Patient presents with new complaint of bilateral posterior tibial tendinitis hurts with ambulation.  She still has some pain in the arch.  She wanted to get evaluated the injection in the heel helped considerably.  She does not have any pain there.  She would also like to discuss orthotics option as well.  Pain scale is 5 out of 10 dull aching nature.   Review of Systems: Negative except as noted in the HPI. Denies N/V/F/Ch.  Past Medical History:  Diagnosis Date   Anemia    Asthma    Back spasm    Endometrial polyp 2011   Fibroid    H/O amenorrhea    H/O bladder infections    H/O insomnia 2012   H/O vaginitis 2012   H/O varicose veins    Hepatitis B 1988 & 1989   History of chicken pox    History of measles, mumps, or rubella    Menopausal symptoms    PMB (postmenopausal bleeding)    Thyroid  disease    Vaginal irritation 12/25/2010   itching also    Current Outpatient Medications:    albuterol  (VENTOLIN  HFA) 108 (90 Base) MCG/ACT inhaler, albuterol  sulfate HFA 90 mcg/actuation aerosol inhaler  INHALE 2 PUFFS BY MOUTH EVERY 6 TO 8 HOURS AS NEEDED, Disp: , Rfl:    atenolol (TENORMIN) 50 MG tablet, atenolol 50 mg tablet, Disp: , Rfl:    atorvastatin (LIPITOR) 20 MG tablet, atorvastatin 20 mg tablet  TAKE 1 TABLET BY MOUTH AT BEDTIME, Disp: , Rfl:    ibuprofen  (ADVIL ,MOTRIN ) 800 MG tablet, Take 800 mg by mouth daily as needed for moderate pain. , Disp: , Rfl: 1   levothyroxine (SYNTHROID) 112 MCG tablet, levothyroxine 112 mcg tablet  TAKE 1 TABLET BY MOUTH DAILY FOR THYROID , Disp: , Rfl:   Social History   Tobacco Use  Smoking Status Never  Smokeless Tobacco Not on file    Allergies  Allergen  Reactions   Codeine Nausea And Vomiting   Objective:  There were no vitals filed for this visit. There is no height or weight on file to calculate BMI. Constitutional Well developed. Well nourished.  Vascular Dorsalis pedis pulses palpable bilaterally. Posterior tibial pulses palpable bilaterally. Capillary refill normal to all digits.  No cyanosis or clubbing noted. Pedal hair growth normal.  Neurologic Normal speech. Oriented to person, place, and time. Epicritic sensation to light touch grossly present bilaterally.  Dermatologic Nails well groomed and normal in appearance. No open wounds. No skin lesions.  Orthopedic: Pain along the course of the posterior tibial tendon including mild pain at the insertion.  Pain with resisted plantarflexion and eversion of the foot no pain with dorsiflexion eversion of the foot.  No pain at the Achilles tendon peroneal tendon ATFL ligament and plantar fascia   Radiographs: None none Assessment:   1. Posterior tibial tendinitis of right leg   2. Posterior tibial tendinitis of left leg   3. Pes planovalgus    Plan:  Patient was evaluated and treated and all questions answered.  Bilateral posterior tibial tendinitis - All questions and concerns were discussed with the patient in extensive detail given the amount of pain  that she is having should benefit from steroid injection to help decrease inflammatory component associate with pain.  I discussed the risk of rupture since it is near the tendon she states understand like to proceed with  Pes planovalgus/foot deformity -I explained to patient the etiology of pes planovalgus and relationship with heel pain/arch pain and various treatment options were discussed.  Given patient foot structure in the setting of heel pain/arch pain I believe patient will benefit from custom-made orthotics to help control the hindfoot motion support the arch of the foot and take the stress away from arches.  Patient  agrees with the plan like to proceed with orthotics -Patient was casted for orthotics   No follow-ups on file.

## 2024-02-25 ENCOUNTER — Telehealth: Payer: Self-pay

## 2024-02-25 NOTE — Telephone Encounter (Signed)
 Called patient to get her scheduled for a [facility] appointment. Unable to connect. Left VM letting her know she can call back and schedule the [facility] appointment.

## 2024-03-24 NOTE — Telephone Encounter (Signed)
 Second attempt on trying to reach out to patient to get her scheduled to come pick up her orthotics. LVM
